# Patient Record
Sex: Male | Born: 1965
Health system: Southern US, Community
[De-identification: ages and names within clinical notes are randomized; demographics above are authoritative.]

## PROBLEM LIST (undated history)

## (undated) DIAGNOSIS — N529 Male erectile dysfunction, unspecified: Secondary | ICD-10-CM

## (undated) DIAGNOSIS — G4730 Sleep apnea, unspecified: Secondary | ICD-10-CM

## (undated) DIAGNOSIS — I10 Essential (primary) hypertension: Secondary | ICD-10-CM

## (undated) DIAGNOSIS — E785 Hyperlipidemia, unspecified: Secondary | ICD-10-CM

## (undated) HISTORY — DX: Hyperlipidemia, unspecified: E78.5

## (undated) HISTORY — DX: Essential (primary) hypertension: I10

## (undated) HISTORY — DX: Male erectile dysfunction, unspecified: N52.9

## (undated) HISTORY — DX: Sleep apnea, unspecified: G47.30

## (undated) HISTORY — PX: CIRCUMCISION: SUR203

---

## 2004-05-29 ENCOUNTER — Ambulatory Visit (HOSPITAL_BASED_OUTPATIENT_CLINIC_OR_DEPARTMENT_OTHER): Admission: RE | Admit: 2004-05-29 | Discharge: 2004-05-29 | Payer: Self-pay | Admitting: Internal Medicine

## 2004-12-15 ENCOUNTER — Ambulatory Visit: Payer: Self-pay | Admitting: Internal Medicine

## 2005-01-23 ENCOUNTER — Ambulatory Visit (HOSPITAL_COMMUNITY): Admission: RE | Admit: 2005-01-23 | Discharge: 2005-01-23 | Payer: Self-pay | Admitting: Urology

## 2005-01-23 ENCOUNTER — Ambulatory Visit (HOSPITAL_BASED_OUTPATIENT_CLINIC_OR_DEPARTMENT_OTHER): Admission: RE | Admit: 2005-01-23 | Discharge: 2005-01-23 | Payer: Self-pay | Admitting: Urology

## 2007-09-26 ENCOUNTER — Ambulatory Visit: Payer: Self-pay | Admitting: Internal Medicine

## 2007-12-26 ENCOUNTER — Telehealth: Payer: Self-pay | Admitting: Internal Medicine

## 2008-01-09 ENCOUNTER — Encounter: Payer: Self-pay | Admitting: Internal Medicine

## 2009-10-01 ENCOUNTER — Ambulatory Visit: Payer: Self-pay | Admitting: Family Medicine

## 2010-06-19 ENCOUNTER — Ambulatory Visit: Payer: Self-pay | Admitting: Internal Medicine

## 2010-06-19 LAB — CONVERTED CEMR LAB
Blood in Urine, dipstick: NEGATIVE
Glucose, Urine, Semiquant: NEGATIVE
Protein, U semiquant: NEGATIVE
Urobilinogen, UA: 1
WBC Urine, dipstick: NEGATIVE
pH: 6

## 2010-06-20 LAB — CONVERTED CEMR LAB
ALT: 37 units/L (ref 0–53)
AST: 39 units/L — ABNORMAL HIGH (ref 0–37)
Alkaline Phosphatase: 76 units/L (ref 39–117)
BUN: 9 mg/dL (ref 6–23)
Basophils Relative: 0.6 % (ref 0.0–3.0)
Bilirubin, Direct: 0.1 mg/dL (ref 0.0–0.3)
Chloride: 106 meq/L (ref 96–112)
Creatinine, Ser: 0.7 mg/dL (ref 0.4–1.5)
Eosinophils Relative: 1.8 % (ref 0.0–5.0)
GFR calc non Af Amer: 151.46 mL/min (ref >60)
HDL: 39.7 mg/dL (ref >39.00)
MCV: 89 fL (ref 78.0–100.0)
Monocytes Relative: 6.4 % (ref 3.0–12.0)
Neutrophils Relative %: 59.5 % (ref 43.0–77.0)
Platelets: 166 10*3/uL (ref 150.0–400.0)
Potassium: 3.5 meq/L (ref 3.5–5.1)
RBC: 4.47 M/uL (ref 4.22–5.81)
Total Bilirubin: 0.5 mg/dL (ref 0.3–1.2)
Total CHOL/HDL Ratio: 5
Total Protein: 6.4 g/dL (ref 6.0–8.3)
VLDL: 27 mg/dL (ref 0.0–40.0)
WBC: 5.4 10*3/uL (ref 4.5–10.5)

## 2010-06-26 ENCOUNTER — Ambulatory Visit: Payer: Self-pay | Admitting: Internal Medicine

## 2010-06-26 DIAGNOSIS — I10 Essential (primary) hypertension: Secondary | ICD-10-CM | POA: Insufficient documentation

## 2010-06-26 DIAGNOSIS — E785 Hyperlipidemia, unspecified: Secondary | ICD-10-CM | POA: Insufficient documentation

## 2010-08-14 ENCOUNTER — Encounter
Admission: RE | Admit: 2010-08-14 | Discharge: 2010-08-14 | Payer: Self-pay | Source: Home / Self Care | Attending: Internal Medicine | Admitting: Internal Medicine

## 2010-09-25 ENCOUNTER — Ambulatory Visit: Payer: Self-pay | Admitting: Internal Medicine

## 2010-11-18 NOTE — Assessment & Plan Note (Signed)
Summary: CPX/NJR   Vital Signs:  Patient profile:   45 year old male Height:      72 inches (182.88 cm) Weight:      273.50 pounds (124.32 kg) BMI:     37.23 Temp:     98.6 degrees F (37.00 degrees C) oral BP sitting:   140 / 100  (left arm) Cuff size:   large  Vitals Entered By: Lucious Groves CMA (June 26, 2010 9:57 AM) CC: CPX./kb Is Patient Diabetic? No Pain Assessment Patient in pain? no        CC:  CPX./kb.  History of Present Illness: cpx wants to lose weight  Current Problems (verified): 1)  Preventive Health Care  (ICD-V70.0)  Current Medications (verified): 1)  None  Allergies (verified): No Known Drug Allergies  Past History:  Past Medical History: Hyperlipidemia Hypertension--dx 2011 no meds as of 9/11  Past Surgical History: circumcision as adult  Family History: Family History Diabetes 1st degree relative--father (bilateral amputation) Family History Hypertension--father Sister---renal failure---? cause 2nd sister---htn  Physical Exam  General:  alert and well-developed.   Head:  normocephalic and atraumatic.   Eyes:  pupils equal and pupils round.   Ears:  R ear normal and L ear normal.   Neck:  No deformities, masses, or tenderness noted. Lungs:  Normal respiratory effort, chest expands symmetrically. Lungs are clear to auscultation, no crackles or wheezes. Heart:  normal rate and regular rhythm.   Abdomen:  Bowel sounds positive,abdomen soft and non-tender without masses, organomegaly or hernias noted.  overweight Msk:  No deformity or scoliosis noted of thoracic or lumbar spine.   Pulses:  R radial normal and L radial normal.   Neurologic:  cranial nerves II-XII intact and gait normal.     Impression & Recommendations:  Problem # 1:  PREVENTIVE HEALTH CARE (ICD-V70.0)  Problem # 2:  OBESITY (ICD-278.00)  Orders: Nutrition Referral (Nutrition)  Problem # 3:  HYPERLIPIDEMIA (ICD-272.4) Assessment: New discussed weght  loss diet low fat dietrefer nutrtionist Labs Reviewed: SGOT: 39 (06/19/2010)   SGPT: 37 (06/19/2010)   HDL:39.70 (06/19/2010)  Chol:216 (06/19/2010)  Trig:135.0 (06/19/2010)  Problem # 4:  HYPERTENSION (ICD-401.9) Assessment: New discussed this is almost certainly weight related discussed weight loss see me 3 months  Patient Instructions: 1)  Please schedule a follow-up appointment in 3 months.   Appended Document: CPX/NJR    Clinical Lists Changes  Orders: Added new Service order of Tdap => 11yrs IM (45409) - Signed Added new Service order of Admin 1st Vaccine (81191) - Signed Observations: Added new observation of TD BOOST VIS: 09/05/08 version given June 26, 2010. (06/26/2010 10:25) Added new observation of TD BOOSTERLO: [id number] (06/26/2010 10:25) Added new observation of TD BOOST EXP: 08/07/2012 (06/26/2010 10:25) Added new observation of TD BOOSTERBY: Lucious Groves CMA (06/26/2010 10:25) Added new observation of TD BOOSTERRT: IM (06/26/2010 10:25) Added new observation of TDBOOSTERDSE: 0.5 ml (06/26/2010 10:25) Added new observation of TD BOOSTERMF: Gl[employer] (06/26/2010 10:25) Added new observation of TD BOOST SIT: right deltoid (06/26/2010 10:25) Added new observation of TD BOOSTER: Tdap (06/26/2010 10:25)       Immunizations Administered:  Tetanus Vaccine:    Vaccine Type: Tdap    Site: right deltoid    Mfr: GlaxoSmithKline    Dose: 0.5 ml    Route: IM    Given by: Lucious Groves CMA    Exp. Date: 08/07/2012    Lot #: [id number]    VIS given: 09/05/08 version given  June 26, 2010.

## 2010-11-18 NOTE — Assessment & Plan Note (Signed)
Summary: 3 month rov/njr 8am/njr   Vital Signs:  Patient profile:   45 year old male Weight:      252 pounds Temp:     97.8 degrees F oral Pulse rate:   76 / minute Pulse rhythm:   regular Resp:     12 per minute BP sitting:   128 / 86  Vitals Entered By: Lynann Beaver CMA AAMA (September 25, 2010 8:15 AM) CC: wgt and BP check Is Patient Diabetic? No Pain Assessment Patient in pain? no        CC:  wgt and BP check.  History of Present Illness: f/u weight-eating healthier/ exercising--jumps rope frequently  time of his physical patient was noted to have hyperlipidemia hypertension. He decided to approach his problems with exercise and diet. He has lost a significant amount of weight.  Patient denies any chest pain, shortness breath, PND. He is exercising regular. He has noted some neck stiffness over the past 2 weeks.  Current Medications (verified): 1)  None  Allergies (verified): No Known Drug Allergies  Past History:  Past Medical History: Last updated: 06/26/2010 Hyperlipidemia Hypertension--dx 2011 no meds as of 9/11  Past Surgical History: Last updated: 06/26/2010 circumcision as adult  Family History: Last updated: 06/26/2010 Family History Diabetes 1st degree relative--father (bilateral amputation) Family History Hypertension--father Sister---renal failure---? cause 2nd sister---htn  Social History: Last updated: 06/03/2007 Married Never Smoked  Risk Factors: Smoking Status: never (06/03/2007)  Physical Exam  General:  well-developed well-nourished male in no acute distress. HEENT exam atraumatic, normocephalic symmetric her muscles are intact. Neck is supple with full range of motion. Chest her auscultation cardiac exam S1-S2 are regular.   Impression & Recommendations:  Problem # 1:  HYPERTENSION (ICD-401.9) blood pressure much improved. I suspect his are related to exercise and weight loss. He will continue the same. I've told him that  I would be glad to check him every 3 months just for a check in regarding his weight. He thinks he can manage this on his own. He'll see me in one year.  Problem # 2:  HYPERLIPIDEMIA (ICD-272.4)  Labs Reviewed: SGOT: 39 (06/19/2010)   SGPT: 37 (06/19/2010)   HDL:39.70 (06/19/2010)  Chol:216 (06/19/2010)  Trig:135.0 (06/19/2010)   Orders Added: 1)  Est. Patient Level III [16109]

## 2011-03-06 NOTE — Procedures (Signed)
NAME:  Daniel Rosales, Daniel Rosales             ACCOUNT NO.:  [id number]   MEDICAL RECORD NO.:  [id number]          PATIENT TYPE:  OUT   LOCATION:  SLEEP CENTER                 FACILITY:  [facility]   PHYSICIAN:  Marcelyn Bruins, M.D. [facility] DATE OF BIRTH:  Jul 10, 1966   DATE OF ADMISSION:  05/29/2004  DATE OF DISCHARGE:  05/29/2004                              NOCTURNAL POLYSOMNOGRAM   REFERRING PHYSICIAN:  Bruce H. Swords, M.D.   INDICATION FOR THE STUDY:  Hypersomnia with sleep apnea.   Epworth sleepiness score is 18.   SLEEP ARCHITECTURE:  The patient had a total sleep time of 431 minutes with  adequate slow wave sleep but decreased REM.  Sleep onset latency was mildly  prolonged at 28 minutes, and REM latency was fairly short.  Sleep efficiency  was 89%.   IMPRESSION:  1. Mild obstructive sleep apnea hypopnea syndrome with oxygen desaturation     to 86%.  The patient had a respiratory disturbance index of 12.8 events     per hour.  The obstructive events were not positional, nor were they     associated primarily with REM.  The patient did not meet split night     criteria due to inadequate numbers of obstructive events in the set     amount of time by protocol.  2. Loud snoring noted throughout the study.  3. No clinically significant cardiac arrhythmias.                                   ______________________________                                Marcelyn Bruins, M.D. [facility]     KC/MEDQ  D:  06/10/2004 11:49:59  T:  06/11/2004 12:48:48  Job:  [id number]   cc:   Valetta Mole. Swords, M.D. [facility]

## 2011-03-06 NOTE — Op Note (Signed)
NAME:  Daniel Rosales, Daniel Rosales               ACCOUNT NO.:  [id number]   MEDICAL RECORD NO.:  [id number]          PATIENT TYPE:  AMB   LOCATION:  [facility]                         FACILITY[facility]   PHYSICIAN:  Bertram Millard. Dahlstedt, M.D.DATE OF BIRTH:  [birth year]   DATE OF PROCEDURE:  01/23/2005  DATE OF DISCHARGE:                                 OPERATIVE REPORT   PREOPERATIVE DIAGNOSIS:  Balanitis.   POSTOPERATIVE DIAGNOSIS:  Balanitis.   PRINCIPLE PROCEDURE:  Circumcision.   SURGEON:  Bertram Millard. Dahlstedt, M.D.   ANESTHESIA:  General.   COMPLICATIONS:  None.   BRIEF HISTORY:  A 45 year old male presenting about a month ago with  recurrent pain in his foreskin, especially after intercourse.  He was found  to have some balanitis and maceration of his foreskin.  He requested  circumcision.  He is scheduled at this time for the procedure.  With the  patient having been counseled on the risks and complications, he desires to  proceed.   PROCEDURE:  Patient was administered a general anesthetic.  He was placed in  the supine position.  The genitalia and perineum were prepped and draped.  Two circumcising incisions were made in the patient's foreskin, one proximal  and distal.  The foreskin was excised.  Small bleeders were carefully  electrocoagulated.  Plain Marcaine 0.5% 10 cc was placed as a dorsal penile  block at this time.  Two sutures were placed in the frenular skin, closing  this in a transverse manner.  Then 4-0 chromic was used.  At this point, a  U stitch was placed, connecting the frenular penile skin.  Quadrant  sutures were placed with simple sutures, using the same 4-0 chromic in  between.  The wound edges were closed in a running fashion.  Dry sterile  dressings were placed.  The patient tolerated the procedure well.  He was  awakened and taken to the PACU in stable condition.  He will follow up in  two weeks.  He was discharged with Vicodin and the usual circumcision  instructions.      SMD/MEDQ  D:  01/23/2005  T:  01/23/2005  Job:  [id number]

## 2011-05-18 ENCOUNTER — Encounter: Payer: Self-pay | Admitting: Internal Medicine

## 2011-05-19 ENCOUNTER — Encounter: Payer: Self-pay | Admitting: Internal Medicine

## 2011-05-19 ENCOUNTER — Ambulatory Visit (INDEPENDENT_AMBULATORY_CARE_PROVIDER_SITE_OTHER): Payer: 59 | Admitting: Internal Medicine

## 2011-05-19 VITALS — BP 132/84 | Temp 98.1°F | Ht 71.0 in | Wt 281.0 lb

## 2011-05-19 DIAGNOSIS — N390 Urinary tract infection, site not specified: Secondary | ICD-10-CM

## 2011-05-19 DIAGNOSIS — R319 Hematuria, unspecified: Secondary | ICD-10-CM | POA: Insufficient documentation

## 2011-05-19 LAB — POCT URINALYSIS DIPSTICK
Bilirubin, UA: NEGATIVE
Blood, UA: NEGATIVE
Glucose, UA: NEGATIVE
Ketones, UA: NEGATIVE
Nitrite, UA: NEGATIVE
Spec Grav, UA: 1.015
pH, UA: 5.5

## 2011-05-19 NOTE — Progress Notes (Signed)
  Subjective:    Patient ID: Daniel Rosales, male    DOB: 1966/09/10, 45 y.o.   MRN: [id number]  HPI Yesterday a.m. Noted hematuria associated with minimal dysuria. sxs have completely resolved He denies any flank pain No hx of hematuria. No recurrent dysuria.   Past Medical History  Diagnosis Date  . Hyperlipidemia   . Hypertension    Past Surgical History  Procedure Date  . Circumcision     reports that he has never smoked. He does not have any smokeless tobacco history on file. His alcohol and drug histories not on file. family history includes Diabetes in his father; Hypertension in his father and sister; and Kidney disease in his sister. No Known Allergies    Review of Systems  patient denies chest pain, shortness of breath, orthopnea. Denies lower extremity edema, abdominal pain, change in appetite, change in bowel movements. Patient denies rashes, musculoskeletal complaints. No other specific complaints in a complete review of systems.      Objective:   Physical Exam  well-developed well-nourished male in no acute distress. HEENT exam atraumatic, normocephalic, neck supple without jugular venous distention. Chest clear to auscultation cardiac exam S1-S2 are regular. Abdominal exam overweight with bowel sounds, soft and nontender. Extremities no edema. Neurologic exam is alert with a normal gait.        Assessment & Plan:

## 2011-05-19 NOTE — Assessment & Plan Note (Signed)
Painless hematuria Needs CT scan Check culture

## 2011-05-25 ENCOUNTER — Ambulatory Visit (INDEPENDENT_AMBULATORY_CARE_PROVIDER_SITE_OTHER)
Admission: RE | Admit: 2011-05-25 | Discharge: 2011-05-25 | Disposition: A | Discharge status: Home / Self Care | Payer: 59 | Source: Ambulatory Visit | Attending: Internal Medicine | Admitting: Internal Medicine

## 2011-05-25 DIAGNOSIS — R319 Hematuria, unspecified: Secondary | ICD-10-CM

## 2011-05-25 MED ORDER — IOHEXOL 300 MG/ML  SOLN
100.0000 mL | Freq: Once | INTRAMUSCULAR | Status: AC | PRN
  Administered 2011-05-25: 100 mL via INTRAVENOUS

## 2011-11-26 ENCOUNTER — Telehealth: Payer: Self-pay | Admitting: *Deleted

## 2011-11-26 ENCOUNTER — Ambulatory Visit (INDEPENDENT_AMBULATORY_CARE_PROVIDER_SITE_OTHER): Payer: 59 | Admitting: Family

## 2011-11-26 ENCOUNTER — Encounter: Payer: Self-pay | Admitting: Family

## 2011-11-26 VITALS — BP 158/90 | Temp 100.1°F | Wt 288.0 lb

## 2011-11-26 DIAGNOSIS — J209 Acute bronchitis, unspecified: Secondary | ICD-10-CM

## 2011-11-26 DIAGNOSIS — R05 Cough: Secondary | ICD-10-CM

## 2011-11-26 DIAGNOSIS — E669 Obesity, unspecified: Secondary | ICD-10-CM

## 2011-11-26 DIAGNOSIS — R059 Cough, unspecified: Secondary | ICD-10-CM

## 2011-11-26 MED ORDER — PREDNISONE 20 MG PO TABS: 60.0000 mg | ORAL_TABLET | Freq: Every day | ORAL | Status: AC

## 2011-11-26 MED ORDER — GUAIFENESIN-CODEINE 100-10 MG/5ML PO SYRP: 5.0000 mL | ORAL_SOLUTION | Freq: Three times a day (TID) | ORAL | Status: AC | PRN

## 2011-11-26 NOTE — Telephone Encounter (Signed)
Appt scheduled with Padonda. 

## 2011-11-26 NOTE — Patient Instructions (Signed)

## 2011-11-26 NOTE — Telephone Encounter (Signed)
Left message to call back for appt

## 2011-11-26 NOTE — Progress Notes (Deleted)
  Subjective:    Patient ID: Daniel Rosales, male    DOB: 13-May-1966, 46 y.o.   MRN: [id number]  HPI    Review of Systems     Objective:   Physical Exam        Assessment & Plan:

## 2011-11-26 NOTE — Progress Notes (Signed)
  Subjective:    Patient ID: Daniel Rosales, male    DOB: [birth year], 46 y.o.   MRN: [id number]  HPI Comments: C/o hacking, constant, nonproductive cough x two days with fatigue. Denies dyspnea, sorethroat, headaches, or other associated s/s.   Cough Associated symptoms include a fever. Pertinent negatives include no shortness of breath or wheezing.  Fever  Associated symptoms include coughing. Pertinent negatives include no wheezing.      Review of Systems  Constitutional: Positive for fever.  HENT: Negative.   Eyes: Negative.   Respiratory: Positive for cough. Negative for apnea, choking, chest tightness, shortness of breath, wheezing and stridor.   Cardiovascular: Negative.    Past Medical History  Diagnosis Date  . Hyperlipidemia   . Hypertension     History   Social History  . Marital Status: Married    Spouse Name: N/A    Number of Children: N/A  . Years of Education: N/A   Occupational History  . Not on file.   Social History Main Topics  . Smoking status: Never Smoker   . Smokeless tobacco: Not on file  . Alcohol Use:   . Drug Use:   . Sexually Active:    Other Topics Concern  . Not on file   Social History Narrative  . No narrative on file    Past Surgical History  Procedure Date  . Circumcision     Family History  Problem Relation Age of Onset  . Diabetes Father   . Hypertension Father   . Kidney disease Sister     renal failure  . Hypertension Sister     No Known Allergies  No current outpatient prescriptions on file prior to visit.    BP 158/90  Temp(Src) 100.1 F (37.8 C) (Oral)  Wt 288 lb (130.636 kg)chart    Objective:   Physical Exam  Constitutional: He is oriented to person, place, and time. He appears well-developed and well-nourished. No distress.  Cardiovascular: Normal rate, regular rhythm, normal heart sounds and intact distal pulses.  Exam reveals no gallop and no friction rub.   No murmur heard. Pulmonary/Chest:  Effort normal and breath sounds normal. No respiratory distress. He has no wheezes. He has no rales.  Neurological: He is alert and oriented to person, place, and time.  Skin: Skin is warm and dry. He is not diaphoretic.          Assessment & Plan:  Assessment: Bronchitis, cough, obesity  Plan: robitussin cough syrup, prednisone, increase po fluids, rest, schedule physical exam. Call if s/s get worse, teaching handout bronchitis provided,

## 2011-12-02 ENCOUNTER — Encounter: Payer: Self-pay | Admitting: Family

## 2011-12-02 ENCOUNTER — Ambulatory Visit (INDEPENDENT_AMBULATORY_CARE_PROVIDER_SITE_OTHER): Payer: 59 | Admitting: Family

## 2011-12-02 VITALS — BP 140/100 | HR 118 | Temp 98.3°F | Wt 288.0 lb

## 2011-12-02 DIAGNOSIS — R05 Cough: Secondary | ICD-10-CM

## 2011-12-02 DIAGNOSIS — R059 Cough, unspecified: Secondary | ICD-10-CM

## 2011-12-02 DIAGNOSIS — J209 Acute bronchitis, unspecified: Secondary | ICD-10-CM

## 2011-12-02 MED ORDER — HYDROCOD POLST-CHLORPHEN POLST 10-8 MG/5ML PO LQCR: 5.0000 mL | Freq: Two times a day (BID) | ORAL | Status: DC

## 2011-12-02 MED ORDER — METHYLPREDNISOLONE ACETATE 80 MG/ML IJ SUSP
80.0000 mg | Freq: Once | INTRAMUSCULAR | Status: AC
  Administered 2011-12-02: 80 mg via INTRAMUSCULAR

## 2011-12-02 MED ORDER — AZITHROMYCIN 250 MG PO TABS: 250.0000 mg | ORAL_TABLET | Freq: Every day | ORAL | Status: AC

## 2011-12-02 NOTE — Patient Instructions (Signed)

## 2011-12-02 NOTE — Progress Notes (Signed)
  Subjective:    Patient ID: Daniel Rosales, male    DOB: 09-Mar-1966, 46 y.o.   MRN: [id number]  HPI 46 year old nonsmoker, patient of Dr. Cato Mulligan is in with complaints of cough, congestion, wheezing that has not improved since last week. He was seen here last week treated with prednisone 60 mg for 5 days and Robitussin with codeine. He continues to have chest congestion productive cough white phlegm. He's also been taking Benadryl and equal with no relief. Denies any lightheadedness, dizziness, chest pain, palpitations or edema.   Review of Systems  Constitutional: Positive for fever, chills and fatigue.  HENT: Positive for congestion, sneezing, postnasal drip and sinus pressure.   Respiratory: Positive for cough and wheezing.   Cardiovascular: Negative.   Musculoskeletal: Negative.   Skin: Negative.   Neurological: Negative.   Hematological: Negative.        Past Medical History  Diagnosis Date  . Hyperlipidemia   . Hypertension     History   Social History  . Marital Status: Married    Spouse Name: N/A    Number of Children: N/A  . Years of Education: N/A   Occupational History  . Not on file.   Social History Main Topics  . Smoking status: Never Smoker   . Smokeless tobacco: Not on file  . Alcohol Use:   . Drug Use:   . Sexually Active:    Other Topics Concern  . Not on file   Social History Narrative  . No narrative on file    Past Surgical History  Procedure Date  . Circumcision     Family History  Problem Relation Age of Onset  . Diabetes Father   . Hypertension Father   . Kidney disease Sister     renal failure  . Hypertension Sister     No Known Allergies  Current Outpatient Prescriptions on File Prior to Visit  Medication Sig Dispense Refill  . guaiFENesin-codeine (ROBITUSSIN AC) 100-10 MG/5ML syrup Take 5 mLs by mouth 3 (three) times daily as needed for cough.  120 mL  0  . predniSONE (DELTASONE) 20 MG tablet Take 3 tablets (60 mg total)  by mouth daily.  15 tablet  0   No current facility-administered medications on file prior to visit.    BP 140/100  Pulse 118  Temp(Src) 98.3 F (36.8 C) (Oral)  Wt 288 lb (130.636 kg)  SpO2 98%chart Objective:   Physical Exam  Constitutional: He is oriented to person, place, and time. He appears well-developed and well-nourished.  HENT:  Right Ear: External ear normal.  Left Ear: External ear normal.  Nose: Nose normal.  Mouth/Throat: Oropharynx is clear and moist.  Neck: Normal range of motion. Neck supple.  Cardiovascular: Normal rate, regular rhythm and normal heart sounds.   Pulmonary/Chest: Effort normal. He has wheezes.       Very mild wheezing.  Musculoskeletal: Normal range of motion.  Neurological: He is alert and oriented to person, place, and time.  Skin: Skin is warm and dry.  Psychiatric: He has a normal mood and affect.          Assessment & Plan:  Assessment: Bronchitis-uncontrolled, cough  Plan: Dulera 100.5 2 puffs twice a day. Z-Pak as directed. Tussionex pen kinetic 1 teaspoon twice a day when necessary cough. Depo-Medrol 80 mg IM x1 was given in the office. Patient to call the office if symptoms worsen or persist, recheck as scheduled and when necessary.

## 2012-03-23 ENCOUNTER — Encounter: Payer: 59 | Admitting: Internal Medicine

## 2012-04-14 ENCOUNTER — Other Ambulatory Visit (INDEPENDENT_AMBULATORY_CARE_PROVIDER_SITE_OTHER): Payer: 59

## 2012-04-14 DIAGNOSIS — Z0000 Encounter for general adult medical examination without abnormal findings: Secondary | ICD-10-CM

## 2012-04-14 LAB — CBC WITH DIFFERENTIAL/PLATELET
Basophils Relative: 0.8 % (ref 0.0–3.0)
Eosinophils Absolute: 0.1 10*3/uL (ref 0.0–0.7)
Eosinophils Relative: 2.4 % (ref 0.0–5.0)
Lymphocytes Relative: 35.4 % (ref 12.0–46.0)
MCHC: 33.1 g/dL (ref 30.0–36.0)
Neutrophils Relative %: 54.4 % (ref 43.0–77.0)
Platelets: 176 10*3/uL (ref 150.0–400.0)
RBC: 4.67 Mil/uL (ref 4.22–5.81)
WBC: 4.7 10*3/uL (ref 4.5–10.5)

## 2012-04-14 LAB — HEPATIC FUNCTION PANEL
Alkaline Phosphatase: 71 U/L (ref 39–117)
Bilirubin, Direct: 0 mg/dL (ref 0.0–0.3)
Total Bilirubin: 0.5 mg/dL (ref 0.3–1.2)
Total Protein: 7 g/dL (ref 6.0–8.3)

## 2012-04-14 LAB — LIPID PANEL
Total CHOL/HDL Ratio: 6
Triglycerides: 118 mg/dL (ref 0.0–149.0)
VLDL: 23.6 mg/dL (ref 0.0–40.0)

## 2012-04-14 LAB — BASIC METABOLIC PANEL
Calcium: 9.1 mg/dL (ref 8.4–10.5)
Creatinine, Ser: 0.8 mg/dL (ref 0.4–1.5)

## 2012-04-14 LAB — POCT URINALYSIS DIPSTICK
Bilirubin, UA: NEGATIVE
Blood, UA: NEGATIVE
Leukocytes, UA: NEGATIVE
Nitrite, UA: NEGATIVE
Protein, UA: NEGATIVE
Urobilinogen, UA: 0.2
pH, UA: 7

## 2012-04-25 ENCOUNTER — Encounter: Payer: Self-pay | Admitting: Internal Medicine

## 2012-04-25 ENCOUNTER — Ambulatory Visit (INDEPENDENT_AMBULATORY_CARE_PROVIDER_SITE_OTHER): Payer: 59 | Admitting: Internal Medicine

## 2012-04-25 VITALS — BP 168/100 | HR 80 | Temp 98.3°F | Ht 70.0 in | Wt 281.0 lb

## 2012-04-25 DIAGNOSIS — Z0000 Encounter for general adult medical examination without abnormal findings: Secondary | ICD-10-CM

## 2012-04-25 DIAGNOSIS — I10 Essential (primary) hypertension: Secondary | ICD-10-CM

## 2012-04-25 DIAGNOSIS — R7309 Other abnormal glucose: Secondary | ICD-10-CM

## 2012-04-25 DIAGNOSIS — R739 Hyperglycemia, unspecified: Secondary | ICD-10-CM | POA: Insufficient documentation

## 2012-04-25 MED ORDER — LISINOPRIL 20 MG PO TABS: 20.0000 mg | ORAL_TABLET | Freq: Every day | ORAL | Status: DC

## 2012-04-25 NOTE — Progress Notes (Signed)
Patient ID: Daniel Rosales, male   DOB: 1966/03/04, 46 y.o.   MRN: [id number]  cpx  htn-- currently on no meds  overweight- not following an exercise or diet plan.   Past Medical History  Diagnosis Date  . Hyperlipidemia   . Hypertension     History   Social History  . Marital Status: Married    Spouse Name: N/A    Number of Children: N/A  . Years of Education: N/A   Occupational History  . Not on file.   Social History Main Topics  . Smoking status: Never Smoker   . Smokeless tobacco: Not on file  . Alcohol Use:   . Drug Use:   . Sexually Active:    Other Topics Concern  . 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 Circumcision     Family History  Problem Relation Age of Onset  . Diabetes Father   . Hypertension Father   . Kidney disease Sister     renal failure  . Hypertension Sister   . Diabetes type II Sister   . Diabetes type II Mother   . Diabetes type II Brother     No Known Allergies  No current outpatient prescriptions on file prior to visit.     patient denies chest pain, shortness of breath, orthopnea. Denies lower extremity edema, abdominal pain, change in appetite, change in bowel movements. Patient denies rashes, musculoskeletal complaints. No other specific complaints in a complete review of systems.   BP 168/100  Pulse 80  Temp 98.3 F (36.8 C) (Oral)  Ht 5\' 10"  (1.778 m)  Wt 281 lb (127.461 kg)  BMI 40.32 kg/m2 Well-developed male in no acute distress. HEENT exam atraumatic, normocephalic, extraocular muscles are intact. Conjunctivae are pink without exudate. Neck is supple without lymphadenopathy, thyromegaly, jugular venous distention. Chest is clear to auscultation without increased work of breathing. Cardiac exam S1-S2 are regular. The PMI is normal. No significant murmurs or gallops. Abdominal exam active bowel sounds, soft, nontender. No  abdominal bruits. Extremities no clubbing cyanosis or edema. Peripheral pulses are normal without bruits. Neurologic exam alert and oriented without any motor or sensory deficits.   Assessment and plan: Well visit. Health maintenance up-to-date. Discussed need for weight loss and blood pressure control.

## 2012-04-25 NOTE — Assessment & Plan Note (Signed)
Needs to start treatment Lisinopril 20

## 2012-04-27 ENCOUNTER — Encounter: Payer: 59 | Admitting: Internal Medicine

## 2012-05-25 ENCOUNTER — Ambulatory Visit: Payer: 59 | Admitting: Internal Medicine

## 2012-05-26 ENCOUNTER — Ambulatory Visit: Payer: 59 | Admitting: *Deleted

## 2012-10-26 ENCOUNTER — Other Ambulatory Visit (INDEPENDENT_AMBULATORY_CARE_PROVIDER_SITE_OTHER): Payer: Self-pay

## 2012-10-26 DIAGNOSIS — R7309 Other abnormal glucose: Secondary | ICD-10-CM

## 2012-10-26 DIAGNOSIS — R739 Hyperglycemia, unspecified: Secondary | ICD-10-CM

## 2012-10-26 LAB — LIPID PANEL
HDL: 41.5 mg/dL (ref >39.00)
Triglycerides: 126 mg/dL (ref 0.0–149.0)

## 2012-10-26 LAB — LDL CHOLESTEROL, DIRECT: Direct LDL: 155.3 mg/dL

## 2012-10-26 LAB — HEPATIC FUNCTION PANEL
ALT: 34 U/L (ref 0–53)
AST: 21 U/L (ref 0–37)
Bilirubin, Direct: 0.1 mg/dL (ref 0.0–0.3)
Total Protein: 6.7 g/dL (ref 6.0–8.3)

## 2012-10-26 LAB — BASIC METABOLIC PANEL
Calcium: 8.8 mg/dL (ref 8.4–10.5)
GFR: 143 mL/min (ref >60.00)
Potassium: 3.8 mEq/L (ref 3.5–5.1)
Sodium: 140 mEq/L (ref 135–145)

## 2012-10-26 LAB — HEMOGLOBIN A1C: Hgb A1c MFr Bld: 5.1 % (ref 4.6–6.5)

## 2012-12-12 ENCOUNTER — Telehealth: Payer: Self-pay | Admitting: Internal Medicine

## 2012-12-12 NOTE — Telephone Encounter (Signed)
Patient Information:  Caller Name: Desiderio  Phone: [phone]  Patient: Daniel Rosales, Daniel Rosales  Gender: Male  DOB: Oct 11, 1966  Age: 47 Years  PCP: Birdie Sons (Adults only)  Office Follow Up:  Does the office need to follow up with this patient?: No  Instructions For The Office: N/A  RN Note:  Patient verbalized understanding of care advice.  Symptoms  Reason For Call & Symptoms: Larey Seat on the ice and Right knee is swollen and has a knot, knee is painful and sensitive to touch.  Patient has a hard time putting weight on that leg.  Reviewed Health History In EMR: Yes  Reviewed Medications In EMR: Yes  Reviewed Allergies In EMR: Yes  Reviewed Surgeries / Procedures: Yes  Date of Onset of Symptoms: 11/30/2012  Guideline(s) Used:  Knee Injury  Disposition Per Guideline:   See Today in Office  Reason For Disposition Reached:   Patient wants to be seen  Advice Given:  Reassurance - Bending or Twisting Injury (Strain, Sprain):  Strain and sprain are the medical terms used to describe over-stretching of the muscles and ligaments of the knee. A twisting or bending injury can cause a strain or sprain.  The main symptom is pain that is worse with movement and walking. Swelling can occur. Rarely there may be slight bruising.  Here is some care advice that should help.  Apply Heat to the Area:  Beginning 48 hours after an injury, apply a warm washcloth or heating pad for 10 minutes three times a day.  This will help increase blood flow and improve healing.  Wrap with an Elastic Bandage:  Wrap the injured part with a snug, elastic bandage for 48 hours.  The pressure from the bandage can make it feel better and help prevent swelling.  If your start to get numbness or tingling of your foot or toes, the bandage may be too tight. Loosen the bandage wrap.  Elevate the Leg:  Lay down and put your leg on a pillow. This puts (elevates) the knee above the heart.  Elevate the Leg:  Lay down and put  your leg on a pillow. This puts (elevates) the knee above the heart.  Do this for 15-20 minutes, 2-3 times a day, for the first two days.  Rest vs. Movement:  Movement is generally more healing in the long term than rest.  Continue normal activities (like walking) as much as your pain permits.  Avoid running and active sports for 1-2 weeks or until the pain and swelling are gone.  Complete rest should only be used for the first day or two after an injury. If it really hurts too much to walk, you will need to see the doctor.  Call Back If:  Pain becomes severe  You become worse.  Appointment Scheduled:  12/12/2012 15:00:00 Appointment Scheduled Provider:  Kriste Basque [facility])

## 2012-12-13 ENCOUNTER — Ambulatory Visit (INDEPENDENT_AMBULATORY_CARE_PROVIDER_SITE_OTHER)
Admission: RE | Admit: 2012-12-13 | Discharge: 2012-12-13 | Disposition: A | Discharge status: Home / Self Care | Payer: BC Managed Care – PPO | Source: Ambulatory Visit | Attending: Family Medicine | Admitting: Family Medicine

## 2012-12-13 ENCOUNTER — Telehealth: Payer: Self-pay | Admitting: Family Medicine

## 2012-12-13 ENCOUNTER — Ambulatory Visit (INDEPENDENT_AMBULATORY_CARE_PROVIDER_SITE_OTHER): Payer: BC Managed Care – PPO | Admitting: Family Medicine

## 2012-12-13 ENCOUNTER — Encounter: Payer: Self-pay | Admitting: Family Medicine

## 2012-12-13 VITALS — BP 162/80 | HR 66 | Temp 98.6°F | Wt 284.0 lb

## 2012-12-13 DIAGNOSIS — M79661 Pain in right lower leg: Secondary | ICD-10-CM

## 2012-12-13 DIAGNOSIS — M949 Disorder of cartilage, unspecified: Secondary | ICD-10-CM

## 2012-12-13 DIAGNOSIS — M899 Disorder of bone, unspecified: Secondary | ICD-10-CM

## 2012-12-13 NOTE — Telephone Encounter (Signed)
Pleas elet him know xray was negative. Advise ice for 15 minutes twice daily and ibuprofen or tylenol if needed for the pain. Follow up with his doctor if continued pain in 2-3 weeks

## 2012-12-13 NOTE — Progress Notes (Signed)
Chief Complaint  Patient presents with  . fall on ice    right knee has a knot; difficult to walk     HPI:  Acute visit for knee pain -started after slipped on ice about 2 weeks ago - did not fall - caught himself but twisted leg -was able to bear weight -pain was not bad - but had some worsening at the end of last week - worsened today after hitting sup tibia on a chair -described as pain in R superior anterior tibia - reports has noticed a knot here -worse with going up stairs -better with rest -denies: giving away, weakness, fevers, severe pain, numbness, clicking or popping   ROS: See pertinent positives and negatives per HPI.  Past Medical History  Diagnosis Date  . Hyperlipidemia   . Hypertension     Family History  Problem Relation Age of Onset  . Diabetes Father   . Hypertension Father   . Kidney disease Sister     renal failure  . Hypertension Sister   . Diabetes type II Sister   . Diabetes type II Mother   . Diabetes type II Brother     History   Social History  . Marital Status: Married    Spouse Name: N/A    Number of Children: N/A  . Years of Education: N/A   Social History Main Topics  . Smoking status: Never Smoker   . Smokeless tobacco: None  . Alcohol Use:   . Drug Use:   . Sexually Active:    Other Topics Concern  . None   Social History Narrative   Works for time Psychologist, forensic   Part timeAdult nurse, Paediatric nurse    Current outpatient prescriptions:lisinopril (PRINIVIL,ZESTRIL) 20 MG tablet, Take 1 tablet (20 mg total) by mouth daily., Disp: 90 tablet, Rfl: 3  EXAM:  Filed Vitals:   12/13/12 1515  BP: 162/80  Pulse: 66  Temp: 98.6 F (37 C)    Body mass index is 40.75 kg/(m^2).  GENERAL: vitals reviewed and listed above, alert, oriented, appears well hydrated and in no acute distress  HEENT: atraumatic, conjunttiva clear, no obvious abnormalities on inspection of external nose and ears  NECK: no obvious masses on inspection  KNEE:   Normal gait, normal ROM and strenght in LE bilat, NV intact bilat LEs On inspection small area of swelling over ant sup tibia near patellar insertion, no erythema, TTP in this area, Neg ant/post drawer, neg lachmans, neg mcmurry. Neg patellar crepitis, no effusion  MS: moves all extremities without noticeable abnormality  PSYCH: pleasant and cooperative, no obvious depression or anxiety  ASSESSMENT AND PLAN:  Discussed the following assessment and plan:  Tibial pain, right - Plan: DG Tibia/Fibula Right -suspect finding on exam more likely from him hitting sup tibia on a chair today - likley soft tissue contusion but will get plain film to exclude fx -otherwise advised ice and gentle activity, ibuprofen or tylenol if needed and follow up in 1 month -Patient advised to return or notify a doctor immediately if symptoms worsen or persist or new concerns arise.  There are no Patient Instructions on file for this visit.   Kriste Basque R.

## 2012-12-14 NOTE — Telephone Encounter (Signed)
Called and spoke with pt and pt is aware.  

## 2013-07-16 IMAGING — CR DG TIBIA/FIBULA 2V*R*
4 series · 4 of 4 positions shown · non-contrast
Comparison: None.

CLINICAL DATA: Pain post injury 2 weeks ago

RIGHT TIBIA AND FIBULA - 2 VIEW

[view not recorded (1 of 4)]
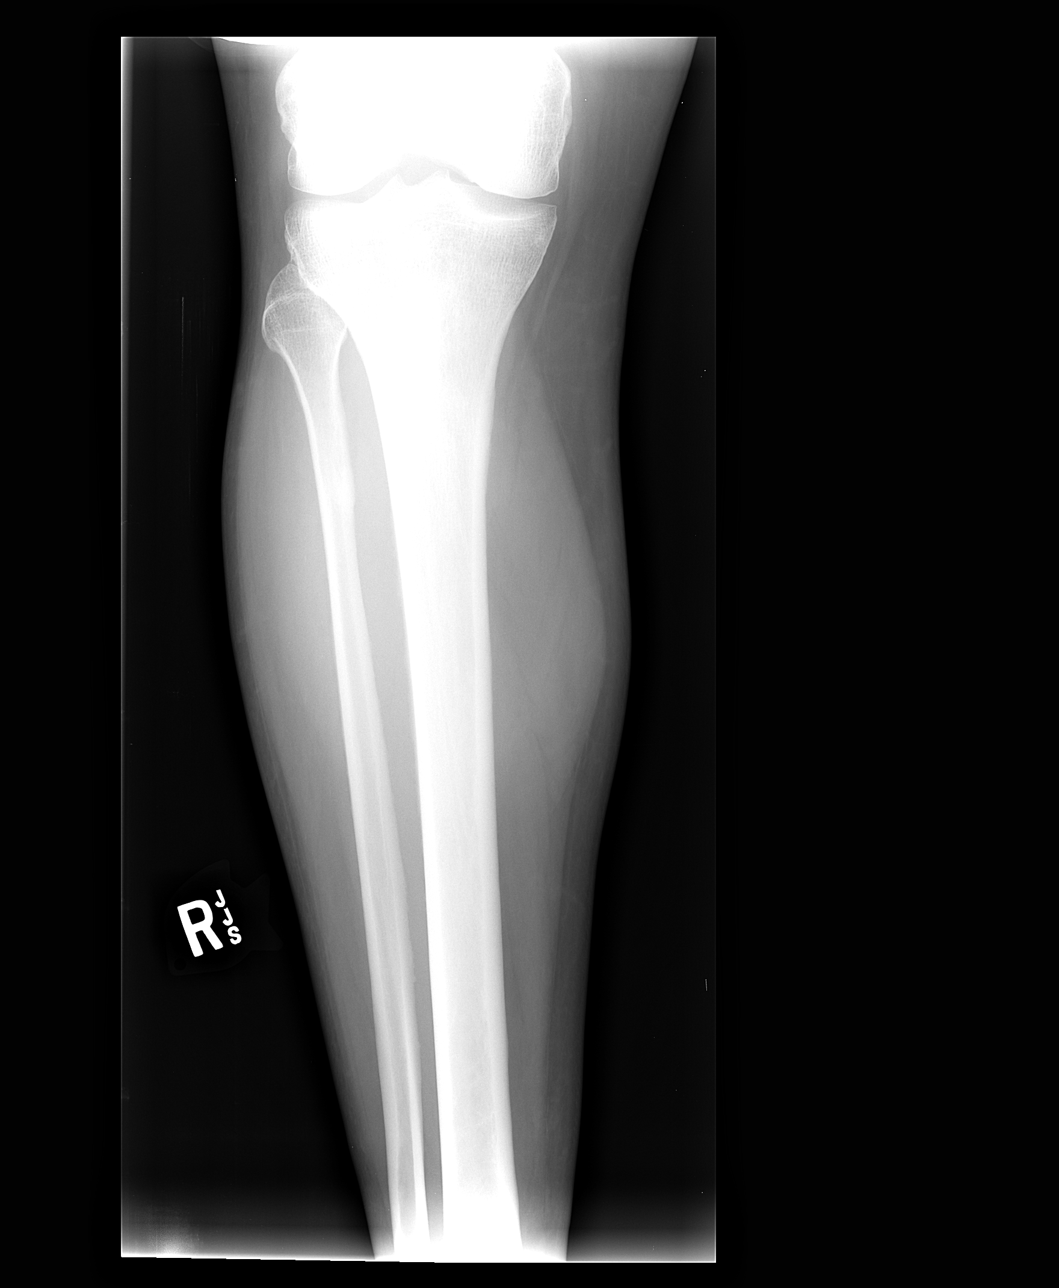

[view not recorded (2 of 4)]
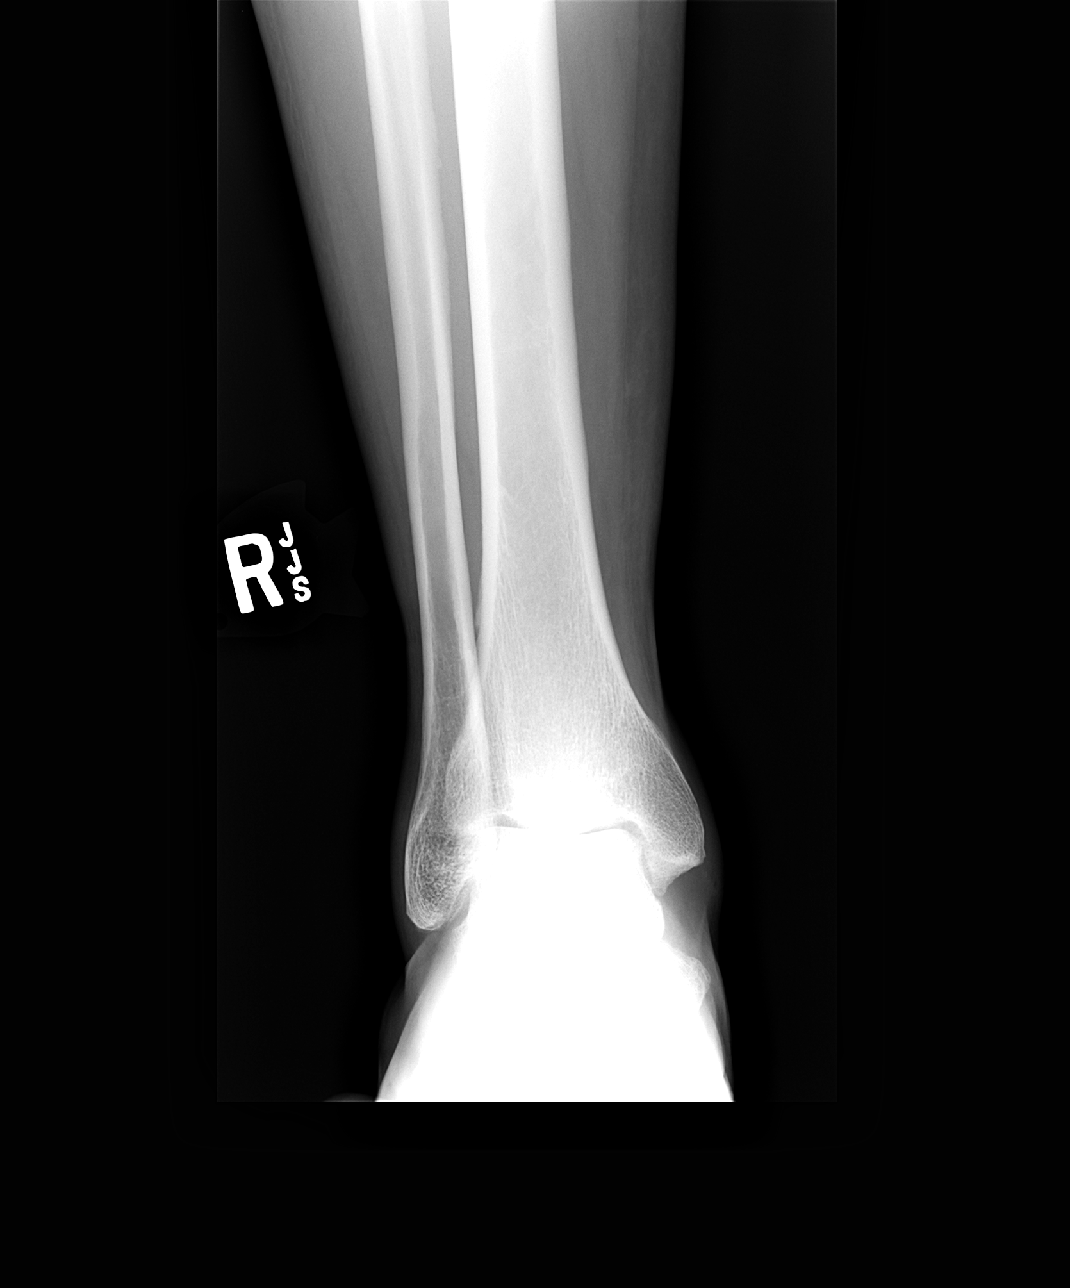

[view not recorded (3 of 4)]
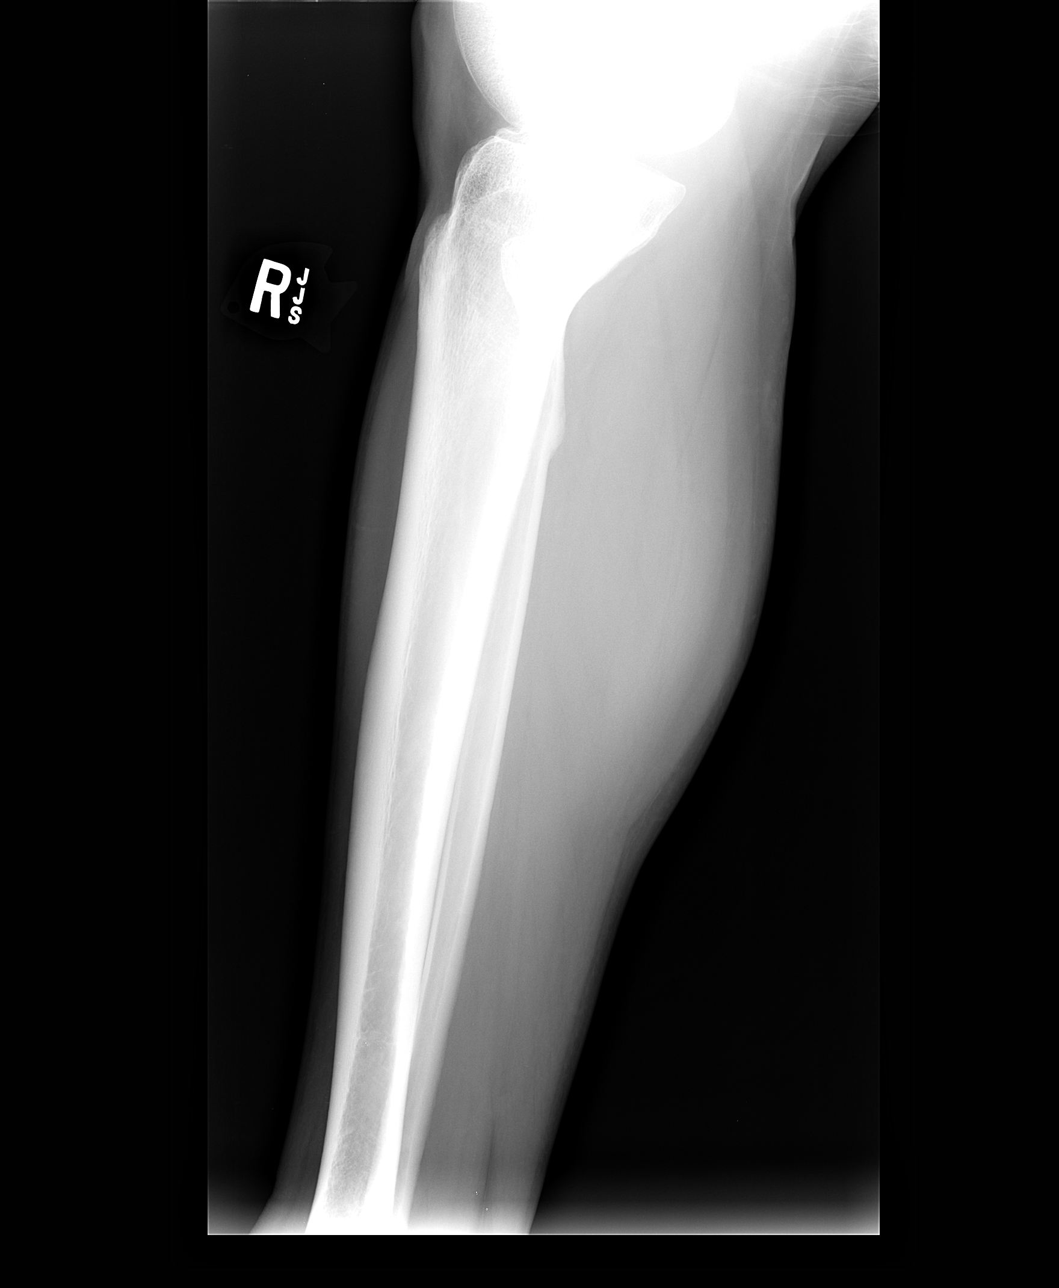

[view not recorded (4 of 4)]
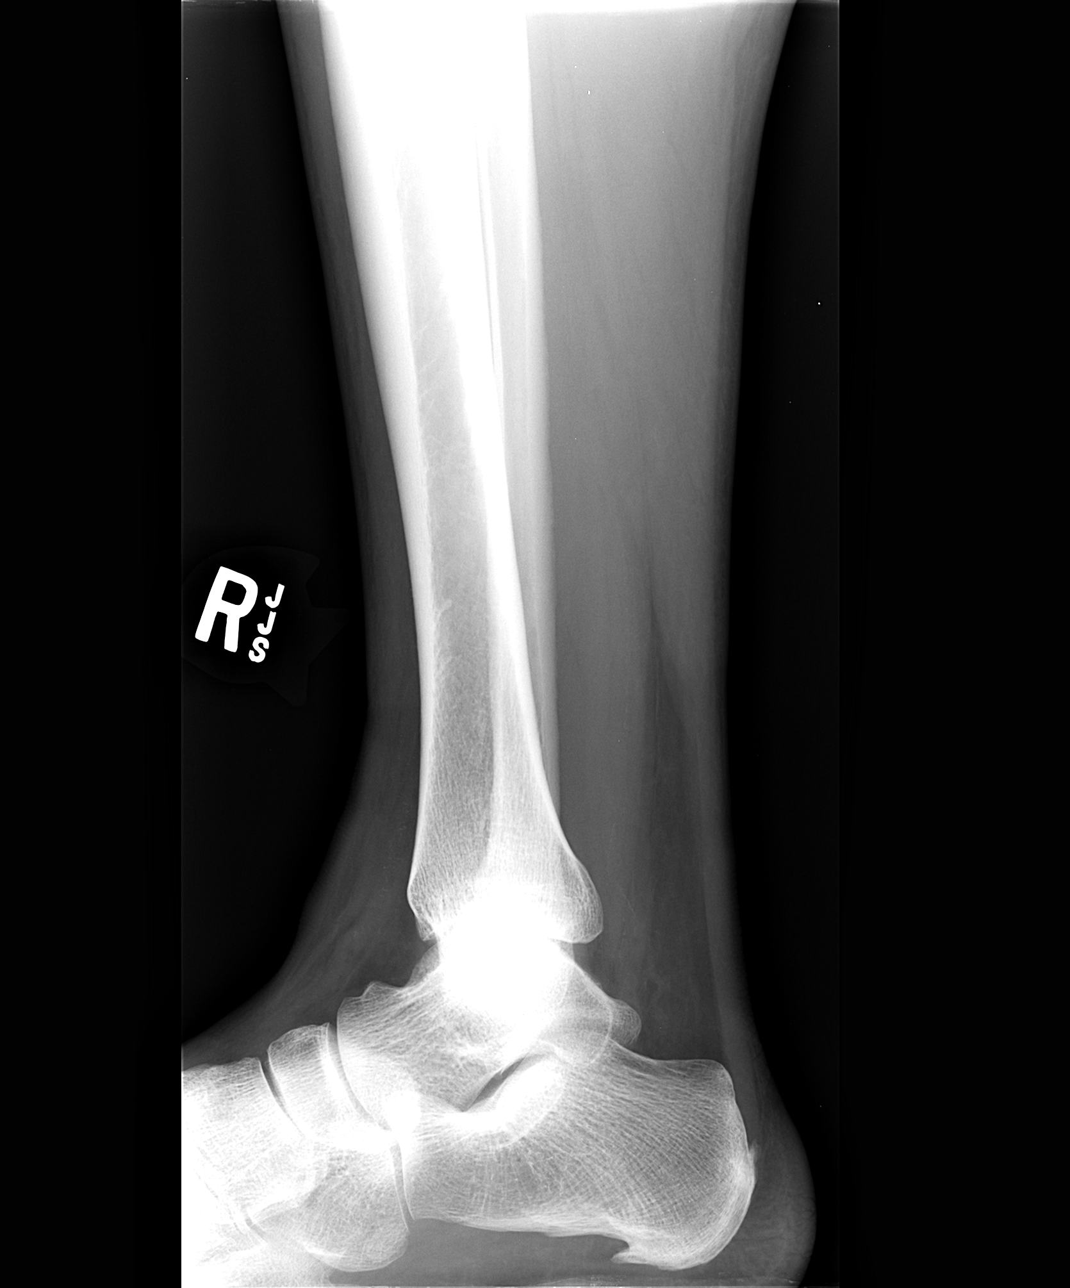

[4 of 4 positions shown; findings below may reference images not displayed]

FINDINGS: Four views of the right tibia-fibula submitted.  No acute
fracture or subluxation.  No periosteal reaction or bony erosion.
Plantar spur of the calcaneus.
IMPRESSION: No acute fracture or subluxation.

## 2015-03-05 ENCOUNTER — Ambulatory Visit (INDEPENDENT_AMBULATORY_CARE_PROVIDER_SITE_OTHER): Payer: Self-pay | Admitting: Family Medicine

## 2015-03-05 ENCOUNTER — Encounter: Payer: Self-pay | Admitting: Family Medicine

## 2015-03-05 VITALS — BP 140/90 | HR 75 | Temp 99.2°F | Wt 280.0 lb

## 2015-03-05 DIAGNOSIS — B349 Viral infection, unspecified: Secondary | ICD-10-CM

## 2015-03-05 DIAGNOSIS — B9789 Other viral agents as the cause of diseases classified elsewhere: Secondary | ICD-10-CM

## 2015-03-05 DIAGNOSIS — J329 Chronic sinusitis, unspecified: Secondary | ICD-10-CM

## 2015-03-05 MED ORDER — GUAIFENESIN-CODEINE 100-10 MG/5ML PO SOLN: 5.0000 mL | Freq: Four times a day (QID) | ORAL | Status: DC | PRN

## 2015-03-05 MED ORDER — PREDNISONE 20 MG PO TABS: ORAL_TABLET | ORAL | Status: DC

## 2015-03-05 NOTE — Patient Instructions (Signed)
Viral Sinus infection  Rest is most important thing- take off next 2 days and today  Lots of hydration  Prednisone to try to calm inflammation  Codeine cough syrup for use at night  If symptoms continue through Monday or if get better then get worse-likely a sign of bacterial infection and will call in antibiotic for you  I also want you to schedule a 15 minute med check/BP check within 3 months and we will make a plan after that for next steps. BP up a little today but not feeling well.

## 2015-03-05 NOTE — Progress Notes (Signed)
  PCP: Judie PetitSWORDS,BRUCE HENRY, MD  Subjective:  Daniel BoutonBrian L Sonnenfeld is a 49 y.o. year old very pleasant male patient who presents with Sinusitis symptoms   Dry cough started Friday (Day 5 of illness), continued to worsen. Cough so much throat started hurting. Cough changed to productive with yellow or white sputum. Nasal congestion as well as nose running, blowing out mainly clear from nose. Feels some wheeze with breathing at times. Symptoms continue to worsen. Has taken corricidan chest congestion and cough. No sick contacts. Some seasonal allergies. Some dental pain. Pain throughout frontal and maxillary sinuses.   ROS-denies fever, SOB, NV,  slight diarrhea  Pertinent Past Medical History- hypertension, HLD  Medications- reviewed  Current Outpatient Prescriptions  Medication Sig Dispense Refill  . lisinopril (PRINIVIL,ZESTRIL) 20 MG tablet Take 20 mg by mouth daily.     No current facility-administered medications for this visit.    Objective: BP 140/90 mmHg  Pulse 75  Temp(Src) 99.2 F (37.3 C)  Wt 280 lb (127.007 kg) Gen: NAD, resting comfortably HEENT: Turbinates erythematous with mainly clear drainage, TM normal, pharynx mildly erythematous with no tonsilar exudate or edema, mild sinus tenderness CV: RRR no murmurs rubs or gallops Lungs: CTAB no crackles, wheeze, rhonchi Abdomen: soft/nontender/nondistended/normal bowel sounds. Obese  Ext: no edema Skin: warm, dry, no rash  Assessment/Plan:  Viral Sinusitis I have a strong suspicion this will end up meeting qualifications for bacterial sinusitis given 5 days of symptoms that continue to worsen. We decided to treat with prednisone and if not 75% better by day 11, Monday- call in Augmentin or if symptoms improve than worsen. Codeine cough syrup for symptomatic relief to help him sleep tonight.   BP up slightly-advised return visit when well.   Meds ordered this encounter  . guaiFENesin-codeine 100-10 MG/5ML syrup    Sig: Take  5 mLs by mouth every 6 (six) hours as needed for cough.    Dispense:  120 mL    Refill:  0  . predniSONE (DELTASONE) 20 MG tablet    Sig: Take 2 pills for 3 days, 1 pill for 3 days, 1/2 pill for 3 days, 1/2 pill every other day until finished    Dispense:  12 tablet    Refill:  0

## 2015-07-11 ENCOUNTER — Encounter: Payer: Self-pay | Admitting: Family Medicine

## 2015-07-11 ENCOUNTER — Ambulatory Visit (INDEPENDENT_AMBULATORY_CARE_PROVIDER_SITE_OTHER): Payer: BLUE CROSS/BLUE SHIELD | Admitting: Family Medicine

## 2015-07-11 VITALS — BP 144/90 | HR 82 | Temp 98.1°F | Wt 294.0 lb

## 2015-07-11 DIAGNOSIS — R319 Hematuria, unspecified: Secondary | ICD-10-CM | POA: Diagnosis not present

## 2015-07-11 DIAGNOSIS — I10 Essential (primary) hypertension: Secondary | ICD-10-CM | POA: Diagnosis not present

## 2015-07-11 LAB — COMPREHENSIVE METABOLIC PANEL
ALT: 51 U/L (ref 0–53)
AST: 30 U/L (ref 0–37)
Albumin: 4.1 g/dL (ref 3.5–5.2)
Alkaline Phosphatase: 80 U/L (ref 39–117)
BUN: 10 mg/dL (ref 6–23)
CALCIUM: 9.2 mg/dL (ref 8.4–10.5)
CHLORIDE: 103 meq/L (ref 96–112)
CO2: 31 meq/L (ref 19–32)
Creatinine, Ser: 0.73 mg/dL (ref 0.40–1.50)
GFR: 148.01 mL/min (ref >60.00)
Glucose, Bld: 89 mg/dL (ref 70–99)
POTASSIUM: 3.7 meq/L (ref 3.5–5.1)
Sodium: 141 mEq/L (ref 135–145)
Total Bilirubin: 0.5 mg/dL (ref 0.2–1.2)
Total Protein: 6.9 g/dL (ref 6.0–8.3)

## 2015-07-11 LAB — CBC
HEMATOCRIT: 40.1 % (ref 39.0–52.0)
HEMOGLOBIN: 13.5 g/dL (ref 13.0–17.0)
MCHC: 33.6 g/dL (ref 30.0–36.0)
MCV: 87 fl (ref 78.0–100.0)
PLATELETS: 184 10*3/uL (ref 150.0–400.0)
RBC: 4.61 Mil/uL (ref 4.22–5.81)
RDW: 12.6 % (ref 11.5–15.5)
WBC: 5.3 10*3/uL (ref 4.0–10.5)

## 2015-07-11 LAB — POCT URINALYSIS DIPSTICK
BILIRUBIN UA: NEGATIVE
Glucose, UA: NEGATIVE
KETONES UA: NEGATIVE
Leukocytes, UA: NEGATIVE
NITRITE UA: NEGATIVE
PH UA: 7
Protein, UA: NEGATIVE
Spec Grav, UA: 1.01
Urobilinogen, UA: NEGATIVE

## 2015-07-11 LAB — URINALYSIS, MICROSCOPIC ONLY: RBC / HPF: NONE SEEN (ref 0–?)

## 2015-07-11 LAB — PSA: PSA: 0.34 ng/mL (ref 0.10–4.00)

## 2015-07-11 MED ORDER — LISINOPRIL 20 MG PO TABS: 20.0000 mg | ORAL_TABLET | Freq: Every day | ORAL | Status: DC

## 2015-07-11 NOTE — Assessment & Plan Note (Signed)
S: Poorly controlled. Noncompliant with lisinopril BP Readings from Last 3 Encounters:  07/11/15 144/90  03/05/15 140/90  12/13/12 162/80  A/P: Asked patient to start lisinopril and follow-up within a month. Recheck at that time

## 2015-07-11 NOTE — Patient Instructions (Signed)
With prior kidney stone, this is the most likely cause  We offered repeat CT scan, you declined unless this occurs again. There is a possibility this could represent malignancy but I do not have a strong feeling that is the cause. If it recurs, consider CT scan or urology referral  Bloodwork before you go.   Start taking blood pressure medicine and see me within a month for a recheck on the medicine

## 2015-07-11 NOTE — Progress Notes (Signed)
Daniel Conch, MD  Subjective:  Daniel Rosales is a 49 y.o. year old very pleasant male patient who presents with:  Hematuria -Sharp pain near tip of penis with peeing then started trickling out drops of blood. Lasted just for a minute. Then right side started hurting. Drank a lot of water now feeling better/pain resolved. Happened years ago- in 2012 and had CT scan showing stone.   Also See problem oriented charting  ROS- no penile discharge, no fever . No CVA tenderness. 2 days ago body aching diffusely and cold chills-now resolved  Past Medical History-never smoker Patient Active Problem List   Diagnosis Date Noted  . Hyperglycemia 04/25/2012  . HYPERLIPIDEMIA 06/26/2010  . OBESITY 06/26/2010  . HYPERTENSION 06/26/2010   Medications- reviewed and updated Current Outpatient Prescriptions  Medication Sig Dispense Refill  . lisinopril (PRINIVIL,ZESTRIL) 20 MG tablet Take 1 tablet (20 mg total) by mouth daily. 30 tablet 5   No current facility-administered medications for this visit.    Objective: BP 144/90 mmHg  Pulse 82  Temp(Src) 98.1 F (36.7 C)  Wt 294 lb (133.358 kg) Gen: NAD, resting comfortably CV: RRR no murmurs rubs or gallops Lungs: CTAB no crackles, wheeze, rhonchi Abdomen: soft/nontender/nondistended/normal bowel sounds. No rebound or guarding.  Rectal: normal tone, mildly enlarged prostate (due to obesity-difficult to get full exam though), no masses or tenderness Ext: no edema Skin: warm, dry, no rash Neuro: grossly normal, moves all extremities  Assessment/Plan:  Essential hypertension S: Poorly controlled. Noncompliant with lisinopril BP Readings from Last 3 Encounters:  07/11/15 144/90  03/05/15 140/90  12/13/12 162/80  A/P: Asked patient to start lisinopril and follow-up within a month. Recheck at that time   Hematuria -Gross hematuria, History of kidney stone most likely cause. I discussed with patient I would advise a repeat CT scan at this  time. He declines. He is aware of risk of malignancy. We also discussed urology visit. He is agreeable to potential urology visit if Recurs. Patient does have some mild BPH although I could not get the best rectal exam. Possible prostatic bleed. Patient has never been screened for prostate cancer and he is in agreement to do PSA today. We will also check a CBC and CMET given the bleeding. I doubt UTI but check urine culture. We will also send for microscopic due to confirm true hematuria.   Asked patient to follow-up within a month for blood pressure recheck. Orders Placed This Encounter  Procedures  . Urine culture    [facility]  . Urine Microscopic Only  . CBC    [place]  . Comprehensive metabolic panel    [place]  . PSA  . POC Urinalysis Dipstick    Meds ordered this encounter  Medications  . lisinopril (PRINIVIL,ZESTRIL) 20 MG tablet    Sig: Take 1 tablet (20 mg total) by mouth daily.    Dispense:  30 tablet    Refill:  5

## 2015-07-13 LAB — URINE CULTURE
COLONY COUNT: NO GROWTH
ORGANISM ID, BACTERIA: NO GROWTH

## 2015-09-20 ENCOUNTER — Encounter: Payer: Self-pay | Admitting: Family Medicine

## 2015-09-20 ENCOUNTER — Ambulatory Visit (INDEPENDENT_AMBULATORY_CARE_PROVIDER_SITE_OTHER): Payer: BLUE CROSS/BLUE SHIELD | Admitting: Family Medicine

## 2015-09-20 VITALS — BP 134/88 | Temp 98.6°F | Wt 272.0 lb

## 2015-09-20 DIAGNOSIS — I10 Essential (primary) hypertension: Secondary | ICD-10-CM

## 2015-09-20 DIAGNOSIS — M67911 Unspecified disorder of synovium and tendon, right shoulder: Secondary | ICD-10-CM | POA: Insufficient documentation

## 2015-09-20 DIAGNOSIS — E785 Hyperlipidemia, unspecified: Secondary | ICD-10-CM

## 2015-09-20 DIAGNOSIS — R739 Hyperglycemia, unspecified: Secondary | ICD-10-CM

## 2015-09-20 DIAGNOSIS — Z0000 Encounter for general adult medical examination without abnormal findings: Secondary | ICD-10-CM | POA: Diagnosis not present

## 2015-09-20 MED ORDER — MELOXICAM 15 MG PO TABS: 15.0000 mg | ORAL_TABLET | Freq: Every day | ORAL | Status: DC

## 2015-09-20 NOTE — Progress Notes (Signed)
Daniel ConchStephen Geraldyn Shain, MD Phone: [phone]  Subjective:  Patient presents today for their annual physical. Chief complaint-noted.   See problem oriented charting ROS- full  review of systems was completed and negative except for: shoulder pain on right as noted. No chest pain or shortness of breath with activity  The following were reviewed and entered/updated in epic: Past Medical History  Diagnosis Date  . Hyperlipidemia   . Hypertension    Patient Active Problem List   Diagnosis Date Noted  . Hyperlipidemia 06/26/2010    Priority: Medium  . Essential hypertension 06/26/2010    Priority: Medium  . Hyperglycemia 04/25/2012    Priority: Low  . OBESITY 06/26/2010    Priority: Low  . Tendinopathy of right rotator cuff 09/20/2015   Past Surgical History  Procedure Laterality Date  . Circumcision      Family History  Problem Relation Age of Onset  . Diabetes Father     amputuation  . Hypertension Father   . Kidney disease Sister     renal failure. unclear cause  . Hypertension Sister   . Diabetes type II Sister   . Diabetes type II Mother   . Diabetes type II Brother     Medications- reviewed and updated Current Outpatient Prescriptions  Medication Sig Dispense Refill  . meloxicam (MOBIC) 15 MG tablet Take 1 tablet (15 mg total) by mouth daily. 30 tablet 0   No current facility-administered medications for this visit.    Allergies-reviewed and updated No Known Allergies  Social History   Social History  . Marital Status: Married    Spouse Name: N/A  . Number of Children: N/A  . Years of Education: N/A   Social History Main Topics  . Smoking status: Never Smoker   . Smokeless tobacco: None  . Alcohol Use: 0.0 - 0.6 oz/week    0-1 Standard drinks or equivalent per week  . Drug Use: No  . Sexual Activity: Not Asked   Other Topics Concern  . None   Social History Narrative   Divorced but back together with wife. 2 daughters ([phone] and [phone]).       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 Oropharynx normal Neck: no thyromegaly CV: RRR no murmurs rubs or gallops Lungs: CTAB no crackles, wheeze, rhonchi Abdomen: soft/nontender/nondistended/normal bowel sounds. No rebound or guarding. obese Ext: no edema Skin: warm, dry Neuro: grossly normal, moves all extremities, PERRLA  Prostate exam done last visit  Assessment/Plan:  49 y.o. male presenting for annual physical.  Health Maintenance counseling: 1. Anticipatory guidance: Patient counseled regarding regular dental exams, eye exams, wearing seatbelts.  2. Risk factor reduction:  Advised patient of need for regular exercise and diet rich and fruits and vegetables to reduce risk of heart attack and stroke. 45 minutes with a way of [employer] fitness 5 days a week. Has lost over 20 lbs.  3. Immunizations/screenings/ancillary studies- declines flu  Health Maintenance Due  Topic Date Due  . HIV screening- done through [facility] 05/16/1983   4. Prostate cancer screening- low risk based off PSA and rectal exam 07/11/15- did have some mild BPH  Lab Results  Component Value Date   PSA 0.34 07/11/2015   5. Colon cancer screening - start at age 49  Immunization History  Administered Date(s) Administered  .  Td 06/26/2010   Hyperlipidemia No rx currently. Losing weight at great rate. Recalculate 10 year risk 09/2016.   Essential hypertension S: controlled. Without medication currently but with 22 lbs weight loss  BP Readings from Last 3 Encounters:  09/20/15 134/88  07/11/15 144/90  03/05/15 140/90  A/P:Continue weight loss efforts. Patient hoping for another 20 lbs within a few months. We will check in 6 months from now   Hyperglycemia Strong family history DM. 04/14/12 CBG 105. Other CBGs normal. Weight loss should lower risk.    Tendinopathy of right rotator cuff S: Years of issues with R shoulder but worse over last few months. Seems to be worse in AM. Does ok with golf but overhead activities or behind back has pain. R shoulder pain 7/10 at worst O: normal strength.Pain with Push off back, hawkings test, neer test positive. Empty can no pain or strength issues.  A/P: 10-14 day course of Mobic. Home exercises. Offered ortho or injectoin but patient wants to try conservative first. Will call in if changes mind- gave 1 month as encouragement to call back.    6 month BP and weight check Labs done in September so will wait 1 year to repeat- did not have lipids but will wait on weight loss before checking again   Meds ordered this encounter  Medications  . meloxicam (MOBIC) 15 MG tablet    Sig: Take 1 tablet (15 mg total) by mouth daily.    Dispense:  30 tablet    Refill:  0

## 2015-09-20 NOTE — Assessment & Plan Note (Signed)
S: Years of issues with R shoulder but worse over last few months. Seems to be worse in AM. Does ok with golf but overhead activities or behind back has pain. R shoulder pain 7/10 at worst O: normal strength.Pain with Push off back, hawkings test, neer test positive. Empty can no pain or strength issues.  A/P: 10-14 day course of Mobic. Home exercises. Offered ortho or injectoin but patient wants to try conservative first. Will call in if changes mind- gave 1 month as encouragement to call back.

## 2015-09-20 NOTE — Assessment & Plan Note (Addendum)
Strong family history DM. 04/14/12 CBG 105. Other CBGs normal. Weight loss should lower risk. a1c was not elevated

## 2015-09-20 NOTE — Patient Instructions (Signed)
Rotator Cuff Injury-  Use meloxicam for 10-14 days (may raise blood pressure short term but your pressure is much better) Take it easy on shoulder for next 3 days then start exercises- only do them if mild or less pain. Could also show them to trainer and they can work with you- want no or very very limited weight at first.  Call me if not doing at least 50% better within a month and I can refer to orthopedics- I could also do an injection here if you would like  [place] with weight loss! Blood pressure controlled without medicine today. Let's follow up in 6 months to make sure still doing well. Then in a year do a physical with labs a week prior so we can see how much cholesterol has improved

## 2015-09-20 NOTE — Assessment & Plan Note (Signed)
S: controlled. Without medication currently but with 22 lbs weight loss  BP Readings from Last 3 Encounters:  09/20/15 134/88  07/11/15 144/90  03/05/15 140/90  A/P:Continue weight loss efforts. Patient hoping for another 20 lbs within a few months. We will check in 6 months from now

## 2015-09-20 NOTE — Assessment & Plan Note (Signed)
No rx currently. Losing weight at great rate. Recalculate 10 year risk 09/2016.

## 2015-10-16 ENCOUNTER — Other Ambulatory Visit: Payer: Self-pay | Admitting: Family Medicine

## 2016-07-24 ENCOUNTER — Other Ambulatory Visit (INDEPENDENT_AMBULATORY_CARE_PROVIDER_SITE_OTHER): Payer: Managed Care, Other (non HMO)

## 2016-07-24 DIAGNOSIS — Z0000 Encounter for general adult medical examination without abnormal findings: Secondary | ICD-10-CM | POA: Diagnosis not present

## 2016-07-24 LAB — POC URINALSYSI DIPSTICK (AUTOMATED)
BILIRUBIN UA: NEGATIVE
Blood, UA: NEGATIVE
Glucose, UA: NEGATIVE
KETONES UA: NEGATIVE
LEUKOCYTES UA: NEGATIVE
Nitrite, UA: NEGATIVE
PROTEIN UA: NEGATIVE
Spec Grav, UA: 1.015
Urobilinogen, UA: 0.2
pH, UA: 6.5

## 2016-07-24 LAB — BASIC METABOLIC PANEL
BUN: 7 mg/dL (ref 6–23)
CHLORIDE: 107 meq/L (ref 96–112)
CO2: 28 mEq/L (ref 19–32)
CREATININE: 0.82 mg/dL (ref 0.40–1.50)
Calcium: 9.2 mg/dL (ref 8.4–10.5)
GFR: 128.86 mL/min (ref >60.00)
GLUCOSE: 91 mg/dL (ref 70–99)
POTASSIUM: 4.4 meq/L (ref 3.5–5.1)
Sodium: 143 mEq/L (ref 135–145)

## 2016-07-24 LAB — HEPATIC FUNCTION PANEL
ALT: 21 U/L (ref 0–53)
AST: 15 U/L (ref 0–37)
Albumin: 3.9 g/dL (ref 3.5–5.2)
Alkaline Phosphatase: 86 U/L (ref 39–117)
BILIRUBIN TOTAL: 0.6 mg/dL (ref 0.2–1.2)
Bilirubin, Direct: 0.1 mg/dL (ref 0.0–0.3)
Total Protein: 6.5 g/dL (ref 6.0–8.3)

## 2016-07-24 LAB — CBC WITH DIFFERENTIAL/PLATELET
BASOS PCT: 0.8 % (ref 0.0–3.0)
Basophils Absolute: 0 10*3/uL (ref 0.0–0.1)
EOS PCT: 1.5 % (ref 0.0–5.0)
Eosinophils Absolute: 0.1 10*3/uL (ref 0.0–0.7)
HCT: 40.6 % (ref 39.0–52.0)
Hemoglobin: 13.9 g/dL (ref 13.0–17.0)
LYMPHS ABS: 1.6 10*3/uL (ref 0.7–4.0)
Lymphocytes Relative: 33.4 % (ref 12.0–46.0)
MCHC: 34.2 g/dL (ref 30.0–36.0)
MCV: 86.6 fl (ref 78.0–100.0)
MONO ABS: 0.3 10*3/uL (ref 0.1–1.0)
Monocytes Relative: 6.2 % (ref 3.0–12.0)
NEUTROS PCT: 58.1 % (ref 43.0–77.0)
Neutro Abs: 2.8 10*3/uL (ref 1.4–7.7)
PLATELETS: 161 10*3/uL (ref 150.0–400.0)
RBC: 4.68 Mil/uL (ref 4.22–5.81)
RDW: 12.8 % (ref 11.5–15.5)
WBC: 4.8 10*3/uL (ref 4.0–10.5)

## 2016-07-24 LAB — TSH: TSH: 1.23 u[IU]/mL (ref 0.35–4.50)

## 2016-07-24 LAB — LIPID PANEL
CHOLESTEROL: 195 mg/dL (ref 0–200)
HDL: 43.2 mg/dL (ref >39.00)
LDL CALC: 134 mg/dL — AB (ref 0–99)
NonHDL: 151.97
TRIGLYCERIDES: 91 mg/dL (ref 0.0–149.0)
Total CHOL/HDL Ratio: 5
VLDL: 18.2 mg/dL (ref 0.0–40.0)

## 2016-07-24 LAB — PSA: PSA: 0.28 ng/mL (ref 0.10–4.00)

## 2016-07-31 ENCOUNTER — Encounter: Payer: Self-pay | Admitting: Family Medicine

## 2016-07-31 ENCOUNTER — Ambulatory Visit (INDEPENDENT_AMBULATORY_CARE_PROVIDER_SITE_OTHER): Payer: Managed Care, Other (non HMO) | Admitting: Family Medicine

## 2016-07-31 VITALS — BP 138/88 | HR 73 | Temp 98.3°F | Ht 70.5 in | Wt 250.2 lb

## 2016-07-31 DIAGNOSIS — E785 Hyperlipidemia, unspecified: Secondary | ICD-10-CM | POA: Diagnosis not present

## 2016-07-31 DIAGNOSIS — Z0000 Encounter for general adult medical examination without abnormal findings: Secondary | ICD-10-CM | POA: Diagnosis not present

## 2016-07-31 NOTE — Patient Instructions (Addendum)
Things look great Wt Readings from Last 3 Encounters:  07/31/16 250 lb 3.2 oz (113.5 kg)  09/20/15 272 lb (123.4 kg)  07/11/15 294 lb (133.4 kg)   You are doing all the work! And luckily we dont have to start any medicines at this point. Blood pressure close to point where medicine may help but lets keep it up with goal of 200 lbs and see how things go.   Keep an eye on blood pressure and if you are noting several over 140/90

## 2016-07-31 NOTE — Progress Notes (Signed)
Pre visit review using our clinic review tool, if applicable. No additional management support is needed unless otherwise documented below in the visit note. 

## 2016-07-31 NOTE — Progress Notes (Signed)
Phone: [phone]  Subjective:  Patient presents today for their annual physical. Chief complaint-noted.   See problem oriented charting- ROS- full  review of systems was completed and negative including No chest pain or shortness of breath. No headache or blurry vision.   The following were reviewed and entered/updated in epic: Past Medical History:  Diagnosis Date  . Hyperlipidemia   . Hypertension    Patient Active Problem List   Diagnosis Date Noted  . Hyperlipidemia 06/26/2010    Priority: Medium  . Essential hypertension 06/26/2010    Priority: Medium  . Hyperglycemia 04/25/2012    Priority: Low  . OBESITY 06/26/2010    Priority: Low  . Tendinopathy of right rotator cuff 09/20/2015   Past Surgical History:  Procedure Laterality Date  . CIRCUMCISION      Family History  Problem Relation Age of Onset  . Diabetes Father     amputuation  . Hypertension Father   . Kidney disease Sister     renal failure. unclear cause  . Hypertension Sister   . Diabetes type II Sister   . Diabetes type II Mother   . Diabetes type II Brother     Medications- reviewed and updated No current outpatient prescriptions on file.   No current facility-administered medications for this visit.     Allergies-reviewed and updated No Known Allergies  Social History   Social History  . Marital status: Married    Spouse name: N/A  . Number of children: N/A  . Years of education: N/A   Social History Main Topics  . Smoking status: Never Smoker  . Smokeless tobacco: None  . Alcohol use 0.0 - 0.6 oz/week  . Drug use: No  . Sexual activity: Not Asked   Other Topics Concern  . None   Social History Narrative   Divorced but back together with wife. 2 daughters (23 and 61).       Works for time Multimedia programmer: enjoys golf    Objective: BP 138/88   Pulse 73   Temp 98.3 F (36.8 C) (Oral)   Ht 5' 10.5" (1.791 m)   Wt 250 lb 3.2 oz (113.5 kg)   SpO2 97%   BMI  35.39 kg/m  Gen: NAD, resting comfortably HEENT: Mucous membranes are moist. Oropharynx normal Neck: no thyromegaly CV: RRR no murmurs rubs or gallops Lungs: CTAB no crackles, wheeze, rhonchi Abdomen: soft/nontender/nondistended/normal bowel sounds. No rebound or guarding.  Ext: no edema Skin: warm, dry Neuro: grossly normal, moves all extremities, PERRLA Rectal: normal tone, diffusely enlarged prostate, no masses or tenderness  Assessment/Plan:  50 y.o. male presenting for annual physical.  Health Maintenance counseling: 1. Anticipatory guidance: Patient counseled regarding regular dental exams, eye exams (goes yearly), wearing seatbelts.  2. Risk factor reduction:  Advised patient of need for regular exercise and diet rich and fruits and vegetables to reduce risk of heart attack and stroke. Going to [employer]- continues with improved diet.  3. Immunizations/screenings/ancillary studies- declines flu shot Immunization History  Administered Date(s) Administered  . Td 06/26/2010  4. Prostate cancer screening- low risk based off PSA trend and rectal. Does have some BPH- oddly with low PSA. Nocturia twice a night usually Lab Results  Component Value Date   PSA 0.28 07/24/2016   PSA 0.34 07/11/2015   5. Colon cancer screening - no family history or rectal bleeding or melena- start age 86  Status of chronic or acute concerns  HLD- mild elevation  of lipids even despite weight losswith 10 year risk 5.7% with initial BP, on repeat BP  Lab Results  Component Value Date   CHOL 195 07/24/2016   HDL 43.20 07/24/2016   LDLCALC 134 (H) 07/24/2016   LDLDIRECT 155.3 10/26/2012   TRIG 91.0 07/24/2016   CHOLHDL 5 07/24/2016   HTN- controlled after weight loss BP Readings from Last 3 Encounters:  07/31/16 138/88  09/20/15 134/88  07/11/15 (!) 144/90   Hyperglycemia- fasting sugar now without elevation for 2 years after 20 lbs weight loss in 2016  Wt Readings from Last 3 Encounters:    07/31/16 250 lb 3.2 oz (113.5 kg)  09/20/15 272 lb (123.4 kg)  07/11/15 294 lb (133.4 kg)  his goal is to get down to 200 lbs.   Return in about 1 year (around 07/31/2017) for physical.  Return precautions advised.   Tana ConchStephen Hunter, MD

## 2018-06-24 ENCOUNTER — Ambulatory Visit: Payer: Managed Care, Other (non HMO) | Admitting: Family Medicine

## 2018-06-29 ENCOUNTER — Encounter: Payer: Self-pay | Admitting: Gastroenterology

## 2018-06-29 ENCOUNTER — Ambulatory Visit (INDEPENDENT_AMBULATORY_CARE_PROVIDER_SITE_OTHER): Payer: Managed Care, Other (non HMO) | Admitting: Family Medicine

## 2018-06-29 ENCOUNTER — Encounter: Payer: Self-pay | Admitting: Family Medicine

## 2018-06-29 DIAGNOSIS — N529 Male erectile dysfunction, unspecified: Secondary | ICD-10-CM | POA: Diagnosis not present

## 2018-06-29 DIAGNOSIS — I10 Essential (primary) hypertension: Secondary | ICD-10-CM

## 2018-06-29 DIAGNOSIS — Z1211 Encounter for screening for malignant neoplasm of colon: Secondary | ICD-10-CM

## 2018-06-29 MED ORDER — SILDENAFIL CITRATE 100 MG PO TABS: 100.0000 mg | ORAL_TABLET | Freq: Every day | ORAL | 11 refills | Status: DC | PRN

## 2018-06-29 MED ORDER — SILDENAFIL CITRATE 20 MG PO TABS: ORAL_TABLET | ORAL | 3 refills | Status: DC

## 2018-06-29 NOTE — Assessment & Plan Note (Addendum)
S: controlled on no rx- as long as keeps weight below his max of near 300 BP Readings from Last 3 Encounters:  06/29/18 116/88  07/31/16 138/88  09/20/15 134/88  A/P: We discussed blood pressure goal of <140/90. Continue without meds and focus on weight loss

## 2018-06-29 NOTE — Assessment & Plan Note (Addendum)
S: Weight up 8 lbs but still down about 30 lbs from peak. He would like to get to 200 lbs but feels like he is at a standstill. Craves foods heavily. He is swimming daily basis for an hour. Doing meal plan with baked chicken and vegetables for 1 big meal a day at lunch and then doesn't eat anything after 6 pm and also skips breakfast. But other than that does some vending machine type eating. May end up grabbing a juice or ginger ale to get some calories/energy. Goal of 200 lbs. Also reports Fruit smoothies with some natural herbs as well  BMI >35 with hypertension and hyperlipidemia history A/P: Patient reports eating well and having trouble losing weight due to food cravings. When questioned further he is trying to do 1 big meal a day then fighting off hunger with what dont sound to be the healthiest snacks. We discussed that 1 meal a day could possibly slow metabolism and make weight loss more difficult. He would like a medication to help curb cravings. I told him a good first step would be a full visit with Jarold Motto, PA and RD and if unable to make progress could consider medication that worked for his wife (phentermine- though I would use the version with topamax)

## 2018-06-29 NOTE — Progress Notes (Signed)
Subjective:  Daniel Rosales is a 52 y.o. year old very pleasant male patient who presents for/with See problem oriented charting ROS- No chest pain or shortness of breath. No headache or blurry vision.    Past Medical History-  Patient Active Problem List   Diagnosis Date Noted  . Hyperlipidemia 06/26/2010    Priority: Medium  . Essential hypertension 06/26/2010    Priority: Medium  . Hyperglycemia 04/25/2012    Priority: Low  . Morbid obesity (HCC) 06/26/2010    Priority: Low  . Erectile dysfunction 06/29/2018  . Tendinopathy of right rotator cuff 09/20/2015    Medications- reviewed and updated Current Outpatient Medications  Medication Sig Dispense Refill  . sildenafil (REVATIO) 20 MG tablet Either sildenafil OR viagra- whichever cheaper. Take 2-5 tablets as needed once every 48 hours for erectile dysfunction 50 tablet 3  . sildenafil (VIAGRA) 100 MG tablet Take 1 tablet (100 mg total) by mouth daily as needed for erectile dysfunction (Either sildenafil OR viagra- whichever is cheaper). 10 tablet 11   No current facility-administered medications for this visit.     Objective: BP 116/88 (BP Location: Left Arm, Patient Position: Sitting, Cuff Size: Large)   Pulse 65   Temp 98.4 F (36.9 C) (Oral)   Ht 5' 10.5" (1.791 m)   Wt 258 lb 9.6 oz (117.3 kg)   SpO2 98%   BMI 36.58 kg/m  Gen: NAD, resting comfortably CV: RRR no murmurs rubs or gallops Lungs: CTAB no crackles, wheeze, rhonchi Abdomen: soft/nontender/ obese Ext: no edema Skin: warm, dry   Assessment/Plan:  Other notes: 1. Physical next month  Morbid obesity (HCC) S: Weight up 8 lbs but still down about 30 lbs from peak. He would like to get to 200 lbs but feels like he is at a standstill. Craves foods heavily. He is swimming daily basis for an hour. Doing meal plan with baked chicken and vegetables for 1 big meal a day at lunch and then doesn't eat anything after 6 pm and also skips breakfast. But other than  that does some vending machine type eating. May end up grabbing a juice or ginger ale to get some calories/energy. Goal of 200 lbs. Also reports Fruit smoothies with some natural herbs as well  BMI >35 with hypertension and hyperlipidemia history A/P: Patient reports eating well and having trouble losing weight due to food cravings. When questioned further he is trying to do 1 big meal a day then fighting off hunger with what dont sound to be the healthiest snacks. We discussed that 1 meal a day could possibly slow metabolism and make weight loss more difficult. He would like a medication to help curb cravings. I told him a good first step would be a full visit with Jarold Motto, PA and RD and if unable to make progress could consider medication that worked for his wife (phentermine- though I would use the version with topamax)  Essential hypertension S: controlled on no rx- as long as keeps weight below his max of near 300 BP Readings from Last 3 Encounters:  06/29/18 116/88  07/31/16 138/88  09/20/15 134/88  A/P: We discussed blood pressure goal of <140/90. Continue without meds and focus on weight loss    Erectile dysfunction S: reports issues with erections for at least a few months. Has continued to swim/lose exercise but has been hard for him to lose weight (see obesity section). He thinks if he could lose more weight and remain active may help A/P:  I agree weight loss worth a trial. With that being said in meantime did rx sildenafil or viagra for him to trial     Future Appointments  Date Time Provider Department Center  07/04/2018  1:00 PM Jarold Motto, [place] [facility]  08/02/2018  2:15 PM Shelva Majestic, [place] [facility]  08/03/2018  3:30 PM [facility] RM[facility]  08/17/2018 11:30 AM Tressia Danas, MD [facility]  Lab/Order associations: Screen for colon cancer - Plan: Ambulatory referral to Gastroenterology  Morbid obesity  [facility])  Essential hypertension  Erectile dysfunction, unspecified erectile dysfunction type  Meds ordered this encounter  Medications  . sildenafil (VIAGRA) 100 MG tablet    Sig: Take 1 tablet (100 mg total) by mouth daily as needed for erectile dysfunction (Either sildenafil OR viagra- whichever is cheaper).    Dispense:  10 tablet    Refill:  11  . sildenafil (REVATIO) 20 MG tablet    Sig: Either sildenafil OR viagra- whichever cheaper. Take 2-5 tablets as needed once every 48 hours for erectile dysfunction    Dispense:  50 tablet    Refill:  3   Return precautions advised.  Tana Conch, MD

## 2018-06-29 NOTE — Patient Instructions (Addendum)
Health Maintenance Due  Topic Date Due  . COLONOSCOPY -We will call you within two weeks about your referral to GI for colonoscopy. If you do not hear within 3 weeks, give [place] a call.   05/15/2018   Schedule a visit with Jarold Motto, PA and RD- this should be an hour long visit for nutrition counseling. Team please get him the homework for this.   Trial  Viagra 50mg  of 100mg  OR silidenafil 20mg  (which you would use likely 40mg  to start and up to 100mg ). Do NOT take these together- take whichever is cheaper

## 2018-06-29 NOTE — Assessment & Plan Note (Signed)
S: reports issues with erections for at least a few months. Has continued to swim/lose exercise but has been hard for him to lose weight (see obesity section). He thinks if he could lose more weight and remain active may help A/P: I agree weight loss worth a trial. With that being said in meantime did rx sildenafil or viagra for him to trial

## 2018-06-30 ENCOUNTER — Telehealth: Payer: Self-pay | Admitting: Family Medicine

## 2018-06-30 NOTE — Telephone Encounter (Signed)
Copied from CRM [phone]. Topic: Quick Communication - See Telephone Encounter >> Jun 30, 2018 11:48 AM Lorrine KinMcGee, Jayda White B, NT wrote: CRM for notification. See Telephone encounter for: 06/30/18. Patietn calling and states that the pharmacy is needing a prior authorization on the sildenafil. Please advise

## 2018-07-01 NOTE — Telephone Encounter (Signed)
Pt calling to check status  °

## 2018-07-04 ENCOUNTER — Encounter: Payer: Self-pay | Admitting: Physician Assistant

## 2018-07-04 ENCOUNTER — Ambulatory Visit (INDEPENDENT_AMBULATORY_CARE_PROVIDER_SITE_OTHER): Payer: Managed Care, Other (non HMO) | Admitting: Physician Assistant

## 2018-07-04 VITALS — BP 150/90 | HR 80 | Temp 98.7°F | Ht 70.5 in | Wt 256.0 lb

## 2018-07-04 DIAGNOSIS — Z713 Dietary counseling and surveillance: Secondary | ICD-10-CM

## 2018-07-04 NOTE — Patient Instructions (Signed)
It was great to see you!  -Start preparing fruit smoothies with protein powder. -Ideas -- boiled eggs, trail mix, tuna pouches, fresh fruit -Watch your portions!! -Push the vegetables -- you can eat as many of the non-starchy vegetables that you like -Keep up drinking plenty of water!! You are doing great.  Take care,  Jarold MottoSamantha Rafeef Lau PA-C

## 2018-07-04 NOTE — Progress Notes (Signed)
Daniel BoutonBrian L Rosales is a 52 y.o. male here for Nutrition Consult.  I acted as a Neurosurgeonscribe for Energy East CorporationSamantha Kipp Shank, PA-C [name], LPN  History of Present Illness:   Chief Complaint  Patient presents with  . Nutrition Counseling    HPI  Patient is here to discuss Nutrition and Diet. Pt would like to lose 50 lbs, would like to weigh 200 lbs. Pt is swimming 3 x's a week for 60 minutes. Approximately 1 year ago, he did a 21 day juice cleanse and AWOL gym --> got down to 275 lb after this.   Wife does grocery shopping and meal prep.  Dietary recall: Wakes up at [employer] Breakfast -- 9am, fruit Lunch -- 12p, peanut butter crackers Dinner -- 5p, grilled chicken and salad Snacks -- no snacks after dinner Beverages  -- 20 oz gingerale x 2 week Alcohol -- very rare  Weight: Wt Readings from Last 3 Encounters:  07/04/18 256 lb (116.1 kg)  06/29/18 258 lb 9.6 oz (117.3 kg)  07/31/16 250 lb 3.2 oz (113.5 kg)    Exercise: Swimming 3 days a week x 1 hour. Has gotten into the swimming for past 3 months. Also does sauna for 10 min. Has a physical job. Gets 15,000 steps daily.  Support system: Wife -- current accountability partner  Sleep: Doesn't sleep well when traveling  Goals: 1- weight 200 lb 2- eat more balanced meals 3- have more energy throughout the day  Estimated daily energy needs: Calories: 1700-1850 kcal Protein: 70-85 g Fluid: at least 2000 ml  Past Medical History:  Diagnosis Date  . Hyperlipidemia   . Hypertension      Social History   Socioeconomic History  . Marital status: Married    Spouse name: Not on file  . Number of children: Not on file  . Years of education: Not on file  . Highest education level: Not on file  Occupational History  . Not on file  Social Needs  . Financial resource strain: Not on file  . Food insecurity:    Worry: Not on file    Inability: Not on file  . Transportation needs:    Medical: Not on file    Non-medical: Not on file   Tobacco Use  . Smoking status: Never Smoker  . Smokeless tobacco: Never Used  Substance and Sexual Activity  . Alcohol use: Yes    Alcohol/week: 0.0 - 1.0 standard drinks  . Drug use: No  . Sexual activity: Not on file  Lifestyle  . Physical activity:    Days per week: Not on file    Minutes per session: Not on file  . Stress: Not on file  Relationships  . Social connections:    Talks on phone: Not on file    Gets together: Not on file    Attends religious service: Not on file    Active member of club or organization: Not on file    Attends meetings of clubs or organizations: Not on file    Relationship status: Not on file  . Intimate partner violence:    Fear of current or ex partner: Not on file    Emotionally abused: Not on file    Physically abused: Not on file    Forced sexual activity: Not on file  Other Topics Concern  . Not on file  Social History Narrative   Divorced but back together with wife. 2 daughters ([id number] and [id number]).       Works for  [employer]      Hobbies: enjoys golf    Past Surgical History:  Procedure Laterality Date  . CIRCUMCISION      Family History  Problem Relation Age of Onset  . Diabetes Father        amputuation  . Hypertension Father   . Kidney disease Sister        renal failure. unclear cause  . Hypertension Sister   . Diabetes type II Sister   . Diabetes type II Mother   . Diabetes type II Brother     No Known Allergies  Current Medications:   Current Outpatient Medications:  .  sildenafil (REVATIO) 20 MG tablet, Either sildenafil OR viagra- whichever cheaper. Take 2-5 tablets as needed once every 48 hours for erectile dysfunction (Patient not taking: Reported on 07/04/2018), Disp: 50 tablet, Rfl: 3 .  sildenafil (VIAGRA) 100 MG tablet, Take 1 tablet (100 mg total) by mouth daily as needed for erectile dysfunction (Either sildenafil OR viagra- whichever is cheaper). (Patient not taking: Reported on 07/04/2018), Disp: 10 tablet,  Rfl: 11   Review of Systems:   ROS  Negative unless otherwise specified per HPI.  Vitals:   Vitals:   07/04/18 1308  BP: (!) 150/90  Pulse: 80  Temp: 98.7 F (37.1 C)  TempSrc: Oral  SpO2: 97%  Weight: 256 lb (116.1 kg)  Height: 5' 10.5" (1.791 m)     Body mass index is 36.21 kg/m.  Physical Exam:   Physical Exam  Constitutional: He is oriented to person, place, and time. He appears well-developed and well-nourished.  HENT:  Head: Normocephalic.  Eyes: Pupils are equal, round, and reactive to light. Conjunctivae and EOM are normal.  Neck: Normal range of motion.  Pulmonary/Chest: Effort normal.  Musculoskeletal: Normal range of motion.  Neurological: He is alert and oriented to person, place, and time.  Skin: Skin is warm and dry.  Psychiatric: He has a normal mood and affect. His behavior is normal. Judgment and thought content normal.  Nursing note and vitals reviewed.   Assessment and Plan:    Chia was seen today for nutrition counseling.  Diagnoses and all orders for this visit:  Encounter for nutritional counseling  Morbid obesity (HCC)   Discussed specific, individualized recommendations regarding nutrition including decreasing sugary beverages, limiting portions, balancing out meals, and eating regularly throughout the day. Handouts provided included: Balanced Plate, Balanced Snack List, Balanced Breakfast, 1800 Calorie Sample Menus. Provided emotional support and encouraged slow, steady weight loss. Patient's questions answered throughout encounter. Follow-up with me prn.  . Reviewed expectations re: course of current medical issues. . Discussed self-management of symptoms. . Outlined signs and symptoms indicating need for more acute intervention. . Patient verbalized understanding and all questions were answered. . See orders for this visit as documented in the electronic medical record. . Patient received an After-Visit Summary.  CMA or LPN  served as scribe during this visit. History, Physical, and Plan performed by medical provider. Documentation and orders reviewed and attested to.  Jarold Motto, PA-C

## 2018-07-05 ENCOUNTER — Telehealth: Payer: Self-pay

## 2018-07-05 NOTE — Telephone Encounter (Signed)
Prior authorization was submitted and approval was received. I called the pharmacy to let them know. They had filled the prescription. 50 tablets is $29. I called Arlys JohnBrian and left a voicemail message to let him know. I provided a call back number for questions.

## 2018-07-05 NOTE — Telephone Encounter (Signed)
  Submitted Prior Authorization for sildenafil (VIAGRA) 100 MG tablet   CaseId:[id number];Status:Approved;Review Type:Prior Auth;Coverage Start Date:06/05/2018;Coverage End Date:07/05/2019;

## 2018-07-25 ENCOUNTER — Encounter: Payer: Managed Care, Other (non HMO) | Admitting: Family Medicine

## 2018-07-27 ENCOUNTER — Telehealth: Payer: Self-pay | Admitting: Family Medicine
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
## 2018-08-02 ENCOUNTER — Encounter: Payer: Managed Care, Other (non HMO) | Admitting: Family Medicine

## 2018-08-03 ENCOUNTER — Ambulatory Visit (AMBULATORY_SURGERY_CENTER): Payer: Self-pay | Admitting: *Deleted

## 2018-08-03 VITALS — Ht 71.0 in | Wt 261.0 lb

## 2018-08-03 DIAGNOSIS — Z1211 Encounter for screening for malignant neoplasm of colon: Secondary | ICD-10-CM

## 2018-08-03 NOTE — Progress Notes (Signed)
Patient denies any allergies to eggs or soy. Patient denies any problems with anesthesia/sedation. Patient denies any oxygen use at home. Patient denies taking any diet/weight loss medications or blood thinners. EMMI education assisgned to patient on colonoscopy, this was explained and instructions given to patient. 

## 2018-08-04 ENCOUNTER — Encounter: Payer: Self-pay | Admitting: Gastroenterology

## 2018-08-04 NOTE — Telephone Encounter (Signed)
Will you please contact pt and schedule before the end of the year.. Will need over ride access.

## 2018-08-04 NOTE — Telephone Encounter (Signed)
im ok trying to work him iin- need to work through people being rescheduled from December 10th first though

## 2018-08-04 NOTE — Telephone Encounter (Signed)
Are you okay with working pt in before the end of the year?

## 2018-08-05 NOTE — Telephone Encounter (Signed)
Called patient left vm reschedule CPE

## 2018-08-17 ENCOUNTER — Ambulatory Visit (AMBULATORY_SURGERY_CENTER): Payer: Managed Care, Other (non HMO) | Admitting: Gastroenterology

## 2018-08-17 ENCOUNTER — Encounter: Payer: Self-pay | Admitting: Gastroenterology

## 2018-08-17 VITALS — BP 122/84 | HR 60 | Temp 98.2°F | Resp 13 | Ht 70.0 in | Wt 256.0 lb

## 2018-08-17 DIAGNOSIS — Z1211 Encounter for screening for malignant neoplasm of colon: Secondary | ICD-10-CM | POA: Diagnosis not present

## 2018-08-17 MED ORDER — SODIUM CHLORIDE 0.9 % IV SOLN: 500.0000 mL | Freq: Once | INTRAVENOUS | Status: DC

## 2018-08-17 NOTE — Patient Instructions (Addendum)
  Thank you for allowing [place] to care for you today!  Resume previous diet and medications today.  Return to normal activities tomorrow.  Handout provided for diverticulosis and high fiber diet.  Repeat colonoscopy in 10 years for screening.    YOU HAD AN ENDOSCOPIC PROCEDURE TODAY AT THE [facility]:   Refer to the procedure report that was given to you for any specific questions about what was found during the examination.  If the procedure report does not answer your questions, please call your gastroenterologist to clarify.  If you requested that your care partner not be given the details of your procedure findings, then the procedure report has been included in a sealed envelope for you to review at your convenience later.  YOU SHOULD EXPECT: Some feelings of bloating in the abdomen. Passage of more gas than usual.  Walking can help get rid of the air that was put into your GI tract during the procedure and reduce the bloating. If you had a lower endoscopy (such as a colonoscopy or flexible sigmoidoscopy) you may notice spotting of blood in your stool or on the toilet paper. If you underwent a bowel prep for your procedure, you may not have a normal bowel movement for a few days.  Please Note:  You might notice some irritation and congestion in your nose or some drainage.  This is from the oxygen used during your procedure.  There is no need for concern and it should clear up in a day or so.  SYMPTOMS TO REPORT IMMEDIATELY:   Following lower endoscopy (colonoscopy or flexible sigmoidoscopy):  Excessive amounts of blood in the stool  Significant tenderness or worsening of abdominal pains  Swelling of the abdomen that is new, acute  Fever of 100F or higher   For urgent or emergent issues, a gastroenterologist can be reached at any hour by calling [phone].   DIET:  We do recommend a small meal at first, but then you may proceed to your regular diet.  Drink plenty  of fluids but you should avoid alcoholic beverages for 24 hours.  ACTIVITY:  You should plan to take it easy for the rest of today and you should NOT DRIVE or use heavy machinery until tomorrow (because of the sedation medicines used during the test).    FOLLOW UP: Our staff will call the number listed on your records the next business day following your procedure to check on you and address any questions or concerns that you may have regarding the information given to you following your procedure. If we do not reach you, we will leave a message.  However, if you are feeling well and you are not experiencing any problems, there is no need to return our call.  We will assume that you have returned to your regular daily activities without incident.  If any biopsies were taken you will be contacted by phone or by letter within the next 1-3 weeks.  Please call us at [phone] if you have not heard about the biopsies in 3 weeks.    SIGNATURES/CONFIDENTIALITY: You and/or your care partner have signed paperwork which will be entered into your electronic medical record.  These signatures attest to the fact that that the information above on your After Visit Summary has been reviewed and is understood.  Full responsibility of the confidentiality of this discharge information lies with you and/or your care-partner.

## 2018-08-17 NOTE — Progress Notes (Signed)
Pt's states no medical or surgical changes since previsit or office visit. 

## 2018-08-17 NOTE — Progress Notes (Signed)
PT taken to PACU. Monitors in place. VSS. Report given to RN. 

## 2018-08-17 NOTE — Op Note (Signed)
[facility] Patient Name: Daniel Rosales Procedure Date: 08/17/2018 12:04 PM MRN: [id number] Endoscopist: Tressia Danas MD, MD Age: 52 Referring MD:  Date of Birth: Aug 20, 1966 Gender: Male Account #: [id number] Procedure:                Colonoscopy Indications:              Screening for colorectal malignant neoplasm. No                            family history of colon cancer or polyps. No                            baseline GI symptoms. Medicines:                See the Anesthesia note for documentation of the                            administered medications Procedure:                Pre-Anesthesia Assessment:                           - Prior to the procedure, a History and Physical                            was performed, and patient medications and                            allergies were reviewed. The patient's tolerance of                            previous anesthesia was also reviewed. The risks                            and benefits of the procedure and the sedation                            options and risks were discussed with the patient.                            All questions were answered, and informed consent                            was obtained. Prior Anticoagulants: The patient has                            taken no previous anticoagulant or antiplatelet                            agents. ASA Grade Assessment: II - A patient with                            mild systemic disease. After reviewing the risks  and benefits, the patient was deemed in                            satisfactory condition to undergo the procedure.                           After obtaining informed consent, the colonoscope                            was passed under direct vision. Throughout the                            procedure, the patient's blood pressure, pulse, and                            oxygen saturations were monitored  continuously. The                            Colonoscope was introduced through the anus and                            advanced to the the terminal ileum, with                            identification of the appendiceal orifice and IC                            valve. The colonoscopy was performed without                            difficulty. The patient tolerated the procedure                            well. The quality of the bowel preparation was good. Scope In: 12:11:36 PM Scope Out: 12:26:08 PM Scope Withdrawal Time: 0 hours 10 minutes 2 seconds  Total Procedure Duration: 0 hours 14 minutes 32 seconds  Findings:                 The perianal and digital rectal examinations were                            normal.                           A few medium-mouthed diverticula were found in the                            sigmoid colon and descending colon.                           The exam was otherwise without abnormality on                            direct and retroflexion views. Complications:  No immediate complications. Estimated Blood Loss:     Estimated blood loss: none. Estimated blood loss:                            none. Impression:               - The entire examined colon is normal on direct and                            retroflexion views.                           - Mild left-sided diverticulosis.                           - No specimens collected. Recommendation:           - Discharge patient to home.                           - Resume previous diet. High-fiber diet recommended                           - Continue present medications.                           - Repeat colonoscopy in 10 years for screening                            purposes based on current colon cancer screening                            guidelines. Repeat colonoscopy recommended prior to                            that time with the development of any new, interval                             symptoms. Tressia Danas MD, MD 08/17/2018 12:33:22 PM This report has been signed electronically.

## 2018-08-18 ENCOUNTER — Telehealth: Payer: Self-pay | Admitting: *Deleted

## 2018-08-18 ENCOUNTER — Telehealth: Payer: Self-pay

## 2018-08-18 NOTE — Telephone Encounter (Signed)
  Follow up Call-  Call back number 08/17/2018  Post procedure Call Back phone  # [phone]  Permission to leave phone message Yes  Some recent data might be hidden     No answer

## 2018-08-18 NOTE — Telephone Encounter (Signed)
Message left

## 2018-10-27 ENCOUNTER — Encounter: Payer: Self-pay | Admitting: Family Medicine

## 2018-10-27 ENCOUNTER — Ambulatory Visit (INDEPENDENT_AMBULATORY_CARE_PROVIDER_SITE_OTHER): Payer: BLUE CROSS/BLUE SHIELD | Admitting: Family Medicine

## 2018-10-27 VITALS — BP 140/78 | HR 86 | Temp 98.7°F | Ht 70.0 in | Wt 246.0 lb

## 2018-10-27 DIAGNOSIS — Z125 Encounter for screening for malignant neoplasm of prostate: Secondary | ICD-10-CM

## 2018-10-27 DIAGNOSIS — Z0000 Encounter for general adult medical examination without abnormal findings: Secondary | ICD-10-CM

## 2018-10-27 DIAGNOSIS — Z6835 Body mass index (BMI) 35.0-35.9, adult: Secondary | ICD-10-CM

## 2018-10-27 DIAGNOSIS — E785 Hyperlipidemia, unspecified: Secondary | ICD-10-CM | POA: Diagnosis not present

## 2018-10-27 DIAGNOSIS — I10 Essential (primary) hypertension: Secondary | ICD-10-CM

## 2018-10-27 DIAGNOSIS — N529 Male erectile dysfunction, unspecified: Secondary | ICD-10-CM

## 2018-10-27 LAB — COMPREHENSIVE METABOLIC PANEL
ALK PHOS: 87 U/L (ref 39–117)
ALT: 18 U/L (ref 0–53)
AST: 14 U/L (ref 0–37)
Albumin: 4.5 g/dL (ref 3.5–5.2)
BILIRUBIN TOTAL: 0.9 mg/dL (ref 0.2–1.2)
BUN: 7 mg/dL (ref 6–23)
CHLORIDE: 102 meq/L (ref 96–112)
CO2: 28 meq/L (ref 19–32)
Calcium: 10 mg/dL (ref 8.4–10.5)
Creatinine, Ser: 0.85 mg/dL (ref 0.40–1.50)
GFR: 122.48 mL/min (ref >60.00)
GLUCOSE: 91 mg/dL (ref 70–99)
Potassium: 3.6 mEq/L (ref 3.5–5.1)
Sodium: 139 mEq/L (ref 135–145)
Total Protein: 7 g/dL (ref 6.0–8.3)

## 2018-10-27 LAB — PSA: PSA: 0.38 ng/mL (ref 0.10–4.00)

## 2018-10-27 LAB — CBC
HCT: 43.9 % (ref 39.0–52.0)
Hemoglobin: 15.1 g/dL (ref 13.0–17.0)
MCHC: 34.5 g/dL (ref 30.0–36.0)
MCV: 87.9 fl (ref 78.0–100.0)
Platelets: 229 10*3/uL (ref 150.0–400.0)
RBC: 4.99 Mil/uL (ref 4.22–5.81)
RDW: 12.7 % (ref 11.5–15.5)
WBC: 7.9 10*3/uL (ref 4.0–10.5)

## 2018-10-27 LAB — LIPID PANEL
Cholesterol: 219 mg/dL — ABNORMAL HIGH (ref 0–200)
HDL: 47.3 mg/dL (ref >39.00)
LDL Cholesterol: 157 mg/dL — ABNORMAL HIGH (ref 0–99)
NonHDL: 171.92
TRIGLYCERIDES: 73 mg/dL (ref 0.0–149.0)
Total CHOL/HDL Ratio: 5
VLDL: 14.6 mg/dL (ref 0.0–40.0)

## 2018-10-27 MED ORDER — SILDENAFIL CITRATE 20 MG PO TABS: ORAL_TABLET | ORAL | 5 refills | Status: DC

## 2018-10-27 NOTE — Assessment & Plan Note (Signed)
Trial of erectile dysfunction medication was helpful- sildenafil

## 2018-10-27 NOTE — Progress Notes (Signed)
Phone: [phone]  Subjective:  Patient presents today for their annual physical. Chief complaint-noted.   See problem oriented charting- ROS- full  review of systems was completed and negative except for: Erectile dysfunction, weight loss but intentional  The following were reviewed and entered/updated in epic: Past Medical History:  Diagnosis Date  . Hyperlipidemia   . Hypertension    Patient Active Problem List   Diagnosis Date Noted  . Hyperlipidemia 06/26/2010    Priority: Medium  . Essential hypertension 06/26/2010    Priority: Medium  . Hyperglycemia 04/25/2012    Priority: Low  . Morbid obesity (HCC) 06/26/2010    Priority: Low  . Erectile dysfunction 06/29/2018  . Tendinopathy of right rotator cuff 09/20/2015   Past Surgical History:  Procedure Laterality Date  . CIRCUMCISION  at age 84    Family History  Problem Relation Age of Onset  . Diabetes type II Mother   . Diabetes Father        amputuation  . Hypertension Father   . Kidney disease Sister        renal failure. unclear cause  . Hypertension Sister   . Diabetes type II Sister   . Diabetes type II Brother   . Colon cancer Neg Hx   . Esophageal cancer Neg Hx   . Stomach cancer Neg Hx   . Rectal cancer Neg Hx   . Colon polyps Neg Hx     Medications- reviewed and updated Current Outpatient Medications  Medication Sig Dispense Refill  . sildenafil (REVATIO) 20 MG tablet Either sildenafil OR viagra- whichever cheaper. Take 2-5 tablets as needed once every 48 hours for erectile dysfunction 50 tablet 5  . UNABLE TO FIND Med Name: herbal supplement sea moss/berdock root/bladderact     No current facility-administered medications for this visit.     Allergies-reviewed and updated No Known Allergies  Social History   Social History Narrative   Divorced but back together with wife. 2 daughters (23 and 83).       Works for time Multimedia programmer: enjoys golf    Objective: BP 140/78 (BP  Location: Left Arm, Cuff Size: Large)   Pulse 86   Temp 98.7 F (37.1 C) (Oral)   Ht 5\' 10"  (1.778 m)   Wt 246 lb (111.6 kg)   SpO2 96%   BMI 35.30 kg/m  Gen: NAD, resting comfortably HEENT: Mucous membranes are moist. Oropharynx normal Neck: no thyromegaly CV: RRR no murmurs rubs or gallops Lungs: CTAB no crackles, wheeze, rhonchi Abdomen: soft/nontender/nondistended/normal bowel sounds. No rebound or guarding. Obesity continues to improve Ext: no edema Skin: warm, dry Neuro: grossly normal, moves all extremities, PERRLA  Assessment/Plan:  53 y.o. male presenting for annual physical.  Health Maintenance counseling: 1. Anticipatory guidance: Patient counseled regarding regular dental exams -q6 months, eye exams -yearly,  avoiding smoking and second hand smoke , limiting alcohol to 2 beverages per day - rare social drinking .   2. Risk factor reduction:  Advised patient of need for regular exercise and diet rich and fruits and vegetables to reduce risk of heart attack and stroke. Exercise- still swimming, starting to do some weights-high rep low eight . Diet-patient is down almost 50 pounds from 2016- but 10 lbs over the last 3 months. Trying to cut down on late night snacking (mainly just eating at work and at the latest 6 PM). Long term goal 200  Technically morbid obesity but he is close to getting  BMI under 35 and then would no longer qualify. Right now BMI over 35 with HTN and HLD qualifying him for this. Counseling as above Wt Readings from Last 3 Encounters:  10/27/18 246 lb (111.6 kg)  08/17/18 256 lb (116.1 kg)  08/03/18 261 lb (118.4 kg)  3. Immunizations/screenings/ancillary studies- declines flu and shingrix shot  Immunization History  Administered Date(s) Administered  . Td 06/26/2010  4. Prostate cancer screening-   Low risk PSA trend. Has had some BPH on exam in past but with low PSA. Nocturia twice a night. We opted to get PSA only to trend unless PSA trend becomes  concerning Lab Results  Component Value Date   PSA 0.28 07/24/2016   PSA 0.34 07/11/2015   5. Colon cancer screening - 08/17/18 with 10-year repeat planned 6. Skin cancer screening- no dermatologist. advised regular sunscreen use. Denies worrisome, changing, or new skin lesions.  7.  Never smoker  Status of chronic or acute concerns   Hyperglycemia-noted in the past.  Has been better over the last 2 years after tremendous weight loss.  Essential hypertension hypertension- mild poorly controlled on initial check- on my repeat actually up to 104/90.  On no medication. He thinks #s have looked good at home but hasnt checked recently. We will do some home monitoring and he will let us know within 2 weeks by mychart. If above 140/90 average then consider low dose amlodipine 2.5mg   Hyperlipidemia Hyperlipidemia- mild elevation of lipids in the past.  We will update lipid panel and ten-year risk of heart attack and stroke-consider statin or coronary CT if risk above 10% on ASCVD risk. Less likely to start statin on him given his continued weight loss.   Erectile dysfunction Trial of erectile dysfunction medication was helpful- sildenafil  4-5 month weight and BP check . Likely sooner if start amlodipine   Lab/Order associations:FASTING  Preventative health care - Plan: CBC, Comprehensive metabolic panel, Lipid panel, PSA  Essential hypertension - Plan: CBC, Comprehensive metabolic panel, Lipid panel  Hyperlipidemia, unspecified hyperlipidemia type - Plan: CBC, Comprehensive metabolic panel, Lipid panel  Erectile dysfunction, unspecified erectile dysfunction type  Screening for prostate cancer - Plan: PSA  Meds ordered this encounter  Medications  . sildenafil (REVATIO) 20 MG tablet    Sig: Either sildenafil OR viagra- whichever cheaper. Take 2-5 tablets as needed once every 48 hours for erectile dysfunction    Dispense:  50 tablet    Refill:  5    Return precautions advised.    Tana Conch, MD

## 2018-10-27 NOTE — Assessment & Plan Note (Signed)
hypertension- mild poorly controlled on initial check- on my repeat actually up to 104/90.  On no medication. He thinks #s have looked good at home but hasnt checked recently. We will do some home monitoring and he will let us know within 2 weeks by mychart. If above 140/90 average then consider low dose amlodipine 2.5mg 

## 2018-10-27 NOTE — Patient Instructions (Addendum)
Please stop by lab before you go  [place] job on losing 10 lbs! Keep up the great effort. Lets check in- in person within 4-6 months with another goal of at least 3-5 lbs in that time frame (you might go past this goal and that's ok)   Your blood pressure trend concerns me. I would like for you to buy/use a home cuff to check at least 4x a week. Your goal is <140/90. Try to use a cuff on your upper arm. Send me a message in 2-3 weeks with a list of your home #s over the last few weeks and we will decide if we need to use a blood pressure medicine like amlodipine while you continue to lose weight (we could come off later if pressures look better)

## 2018-10-27 NOTE — Assessment & Plan Note (Signed)
Hyperlipidemia- mild elevation of lipids in the past.  We will update lipid panel and ten-year risk of heart attack and stroke-consider statin or coronary CT if risk above 10% on ASCVD risk. Less likely to start statin on him given his continued weight loss.

## 2019-05-11 ENCOUNTER — Ambulatory Visit: Payer: BLUE CROSS/BLUE SHIELD | Admitting: Family Medicine

## 2019-05-11 DIAGNOSIS — Z0289 Encounter for other administrative examinations: Secondary | ICD-10-CM

## 2019-05-18 ENCOUNTER — Other Ambulatory Visit: Payer: Self-pay

## 2019-05-18 ENCOUNTER — Ambulatory Visit (INDEPENDENT_AMBULATORY_CARE_PROVIDER_SITE_OTHER): Payer: BC Managed Care – PPO | Admitting: Family Medicine

## 2019-05-18 ENCOUNTER — Ambulatory Visit (INDEPENDENT_AMBULATORY_CARE_PROVIDER_SITE_OTHER): Payer: BC Managed Care – PPO

## 2019-05-18 ENCOUNTER — Encounter: Payer: Self-pay | Admitting: Family Medicine

## 2019-05-18 VITALS — BP 142/86 | HR 77 | Temp 97.9°F | Resp 16 | Ht 70.0 in | Wt 273.8 lb

## 2019-05-18 DIAGNOSIS — S93401A Sprain of unspecified ligament of right ankle, initial encounter: Secondary | ICD-10-CM | POA: Diagnosis not present

## 2019-05-18 NOTE — Patient Instructions (Signed)
Please follow up if symptoms do not improve or as needed.   I have ordered physical therapy to rehab your ankle. We will call you to get this set up.    Ankle Sprain  An ankle sprain is a stretch or tear in a ligament in the ankle. Ligaments are tissues that connect bones to each other. The two most common types of ankle sprains are:  Inversion sprain. This happens when the foot turns inward and the ankle rolls outward. It affects the ligament on the outside of the foot (lateral ligament).  Eversion sprain. This happens when the foot turns outward and the ankle rolls inward. It affects the ligament on the inner side of the foot (medial ligament). What are the causes? This condition is often caused by accidentally rolling or twisting the ankle. What increases the risk? You are more likely to develop this condition if you play sports. What are the signs or symptoms? Symptoms of this condition include:  Pain in your ankle.  Swelling.  Bruising. This may develop right after you sprain your ankle or 1-2 days later.  Trouble standing or walking, especially when you turn or change directions. How is this diagnosed? This condition is diagnosed with:  A physical exam. During the exam, your health care provider will press on certain parts of your foot and ankle and try to move them in certain ways.  X-ray imaging. These may be taken to see how severe the sprain is and to check for broken bones. How is this treated? This condition may be treated with:  A brace or splint. This is used to keep the ankle from moving until it heals.  An elastic bandage. This is used to support the ankle.  Crutches.  Pain medicine.  Surgery. This may be needed if the sprain is severe.  Physical therapy. This may help to improve the range of motion in the ankle. Follow these instructions at home: If you have a brace or a splint:  Wear the brace or splint as told by your health care provider. Remove  it only as told by your health care provider.  Loosen the brace or splint if your toes tingle, become numb, or turn cold and blue.  Keep the brace or splint clean.  If the brace or splint is not waterproof: ? Do not let it get wet. ? Cover it with a watertight covering when you take a bath or a shower. If you have an elastic bandage (dressing):  Remove it to shower or bathe.  Try not to move your ankle much, but wiggle your toes from time to time. This helps to prevent swelling.  Adjust the dressing to make it more comfortable if it feels too tight.  Loosen the dressing if you have numbness or tingling in your foot, or if your foot becomes cold and blue. Managing pain, stiffness, and swelling   Take over-the-counter and prescription medicines only as told by your health care provider.  For 2-3 days, keep your ankle raised (elevated) above the level of your heart as much as possible.  If directed, put ice on the injured area: ? If you have a removable brace or splint, remove it as told by your health care provider. ? Put ice in a plastic bag. ? Place a towel between your skin and the bag. ? Leave the ice on for 20 minutes, 2-3 times a day. General instructions  Rest your ankle.  Do not use the injured limb to support  your body weight until your health care provider says that you can. Use crutches as told by your health care provider.  Do not use any products that contain nicotine or tobacco, such as cigarettes, e-cigarettes, and chewing tobacco. If you need help quitting, ask your health care provider.  Keep all follow-up visits as told by your health care provider. This is important. Contact a health care provider if:  You have rapidly increasing bruising or swelling.  Your pain is not relieved with medicine. Get help right away if:  Your foot or toes become numb or blue.  You have severe pain that gets worse. Summary  An ankle sprain is a stretch or tear in a  ligament in the ankle. Ligaments are tissues that connect bones to each other.  This condition is often caused by accidentally rolling or twisting the ankle.  Symptoms include pain, swelling, bruising, and trouble walking.  To relieve pain and swelling, put ice on the affected ankle, raise your ankle above the level of your heart, and use an elastic bandage.  Keep all follow-up visits as told by your health care provider. This is important. This information is not intended to replace advice given to you by your health care provider. Make sure you discuss any questions you have with your health care provider. Document Released: 10/05/2005 Document Revised: 06/27/2018 Document Reviewed: 03/01/2018 Elsevier Patient Education  2020 [employer]

## 2019-05-18 NOTE — Progress Notes (Signed)
Please call patient: I have reviewed his/her lab results. Xray of ankle looks ok. No fractures. PT as discussed.

## 2019-05-18 NOTE — Progress Notes (Signed)
Subjective  CC:  Chief Complaint  Patient presents with  . Ankle Pain    Right side, 2 months ago. Almost fell through the ceiling.. Twisted ankle, has used ice and imobilized it.Marland Kitchen He reports he had been using Tylenol and IBU    HPI: Daniel Rosales is a 53 y.o. male who presents to the office today to address the problems listed above in the chief complaint.  53 year old male who was climbing a ladder in his attic when one foot fell through the roof and the right foot got wedged between 2 joist boards.  He had immediate moderate to severe lateral swelling and pain.  He continued to work through that day and was able to ambulate.  Over the course of the next 2 weeks swelling improved.  He did use an Ace wrap but never rested the ankle.  He never sought any further evaluation.  Now continues to have lateral ankle pain worse with prolonged walking.  No locking or give way.  No history of ankle problems.  No recent or prior ankle sprains.  He denies leg pain or foot pain. Assessment  1. Sprain of right ankle, unspecified ligament, initial encounter      Plan   Chronic sprain:  Ankle brace, xray, PT for rehab and education. nsaids as needed.   Follow up: Return if symptoms worsen or fail to improve.  Visit date not found  Orders Placed This Encounter  Procedures  . DG Ankle Complete Right  . Ambulatory referral to Physical Therapy   No orders of the defined types were placed in this encounter.     I reviewed the patients updated PMH, FH, and SocHx.    Patient Active Problem List   Diagnosis Date Noted  . Erectile dysfunction 06/29/2018  . Tendinopathy of right rotator cuff 09/20/2015  . Hyperglycemia 04/25/2012  . Hyperlipidemia 06/26/2010  . Morbid obesity ([place]) 06/26/2010  . Essential hypertension 06/26/2010   Current Meds  Medication Sig  . sildenafil (REVATIO) 20 MG tablet Either sildenafil OR viagra- whichever cheaper. Take 2-5 tablets as needed once every 48 hours  for erectile dysfunction  . UNABLE TO FIND Med Name: herbal supplement sea moss/berdock root/bladderact    Allergies: Patient has No Known Allergies. Family History: Patient family history includes Diabetes in his father; Diabetes type II in his brother, mother, and sister; Hypertension in his father and sister; Kidney disease in his sister. Social History:  Patient  reports that he has never smoked. He has never used smokeless tobacco. He reports current alcohol use. He reports that he does not use drugs.  Review of Systems: Constitutional: Negative for fever malaise or anorexia Cardiovascular: negative for chest pain Respiratory: negative for SOB or persistent cough Gastrointestinal: negative for abdominal pain  Objective  Vitals: BP (!) 142/86   Pulse 77   Temp 97.9 F (36.6 C) (Oral)   Resp 16   Ht 5\' 10"  (1.778 m)   Wt 273 lb 12.8 oz (124.2 kg)   SpO2 97%   BMI 39.29 kg/m  General: no acute distress , A&Ox3 No limp Skin:  Warm, no rashes Right ankle: lateral ligament ttp and pain with inversion, otherwise stable joint. No posterior ttp, no metatarsal ttp.     Commons side effects, risks, benefits, and alternatives for medications and treatment plan prescribed today were discussed, and the patient expressed understanding of the given instructions. Patient is instructed to call or message via MyChart if he/she has any questions or  concerns regarding our treatment plan. No barriers to understanding were identified. We discussed Red Flag symptoms and signs in detail. Patient expressed understanding regarding what to do in case of urgent or emergency type symptoms.   Medication list was reconciled, printed and provided to the patient in AVS. Patient instructions and summary information was reviewed with the patient as documented in the AVS. This note was prepared with assistance of Dragon voice recognition software. Occasional wrong-word or sound-a-like substitutions may have  occurred due to the inherent limitations of voice recognition software

## 2019-05-19 ENCOUNTER — Ambulatory Visit: Payer: Self-pay | Admitting: Family Medicine

## 2019-05-19 NOTE — Telephone Encounter (Signed)
Noted  

## 2019-05-19 NOTE — Telephone Encounter (Signed)
See note

## 2019-05-19 NOTE — Telephone Encounter (Signed)
Pt returned call and was given x-ray results from Dr. Billey Chang dated 05/18/2019 at 4:45 PM. No questions from pt.   He was glad it was not broken/fractured.    He verbalized understanding of the PT that was discussed during his visit.

## 2019-05-26 ENCOUNTER — Encounter: Payer: Self-pay | Admitting: Family Medicine

## 2019-06-01 ENCOUNTER — Ambulatory Visit: Payer: BC Managed Care – PPO | Admitting: Physical Therapy

## 2019-08-17 DIAGNOSIS — I10 Essential (primary) hypertension: Secondary | ICD-10-CM | POA: Diagnosis not present

## 2019-08-17 DIAGNOSIS — E785 Hyperlipidemia, unspecified: Secondary | ICD-10-CM | POA: Diagnosis not present

## 2019-08-17 DIAGNOSIS — Z1331 Encounter for screening for depression: Secondary | ICD-10-CM | POA: Diagnosis not present

## 2019-12-05 DIAGNOSIS — Z0000 Encounter for general adult medical examination without abnormal findings: Secondary | ICD-10-CM | POA: Diagnosis not present

## 2019-12-05 DIAGNOSIS — E7849 Other hyperlipidemia: Secondary | ICD-10-CM | POA: Diagnosis not present

## 2019-12-05 DIAGNOSIS — R82998 Other abnormal findings in urine: Secondary | ICD-10-CM | POA: Diagnosis not present

## 2019-12-05 DIAGNOSIS — I10 Essential (primary) hypertension: Secondary | ICD-10-CM | POA: Diagnosis not present

## 2019-12-06 DIAGNOSIS — Z1212 Encounter for screening for malignant neoplasm of rectum: Secondary | ICD-10-CM | POA: Diagnosis not present

## 2019-12-12 DIAGNOSIS — E785 Hyperlipidemia, unspecified: Secondary | ICD-10-CM | POA: Diagnosis not present

## 2019-12-12 DIAGNOSIS — Z0000 Encounter for general adult medical examination without abnormal findings: Secondary | ICD-10-CM | POA: Diagnosis not present

## 2019-12-12 DIAGNOSIS — I10 Essential (primary) hypertension: Secondary | ICD-10-CM | POA: Diagnosis not present

## 2019-12-12 DIAGNOSIS — Z125 Encounter for screening for malignant neoplasm of prostate: Secondary | ICD-10-CM | POA: Diagnosis not present

## 2019-12-19 IMAGING — DX RIGHT ANKLE - COMPLETE 3+ VIEW
3 series · 3 of 3 positions shown · non-contrast
Comparison: None

CLINICAL DATA: Ankle sprain RIGHT ankle 2 months ago, initial
encounter

EXAM:
RIGHT ANKLE - COMPLETE 3+ VIEW

[ankle ap]
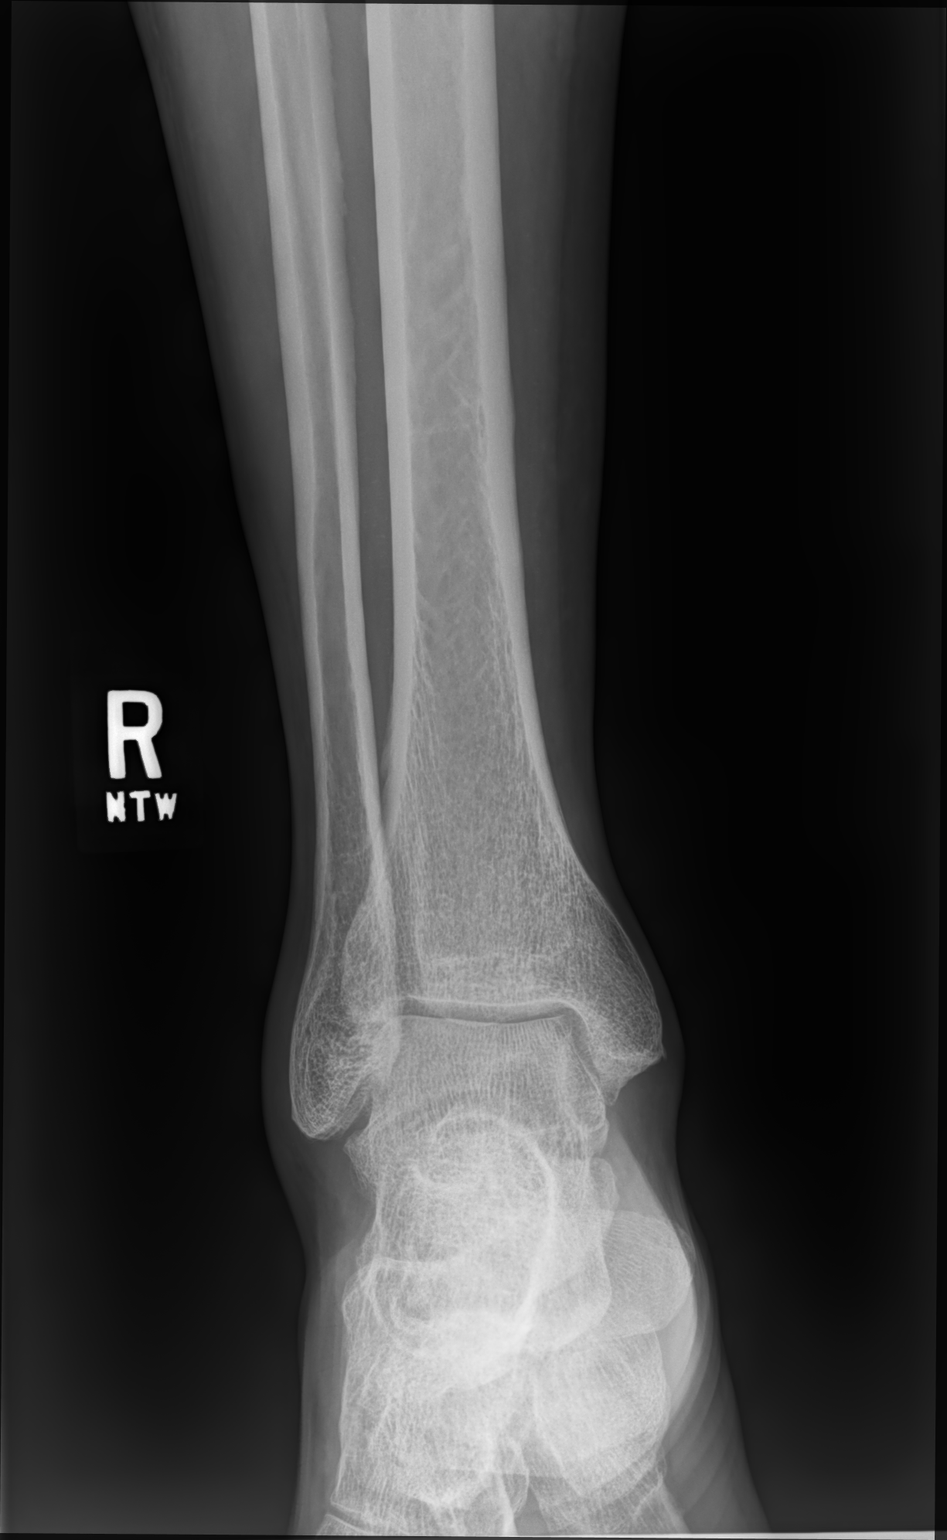

[ankle oblique]
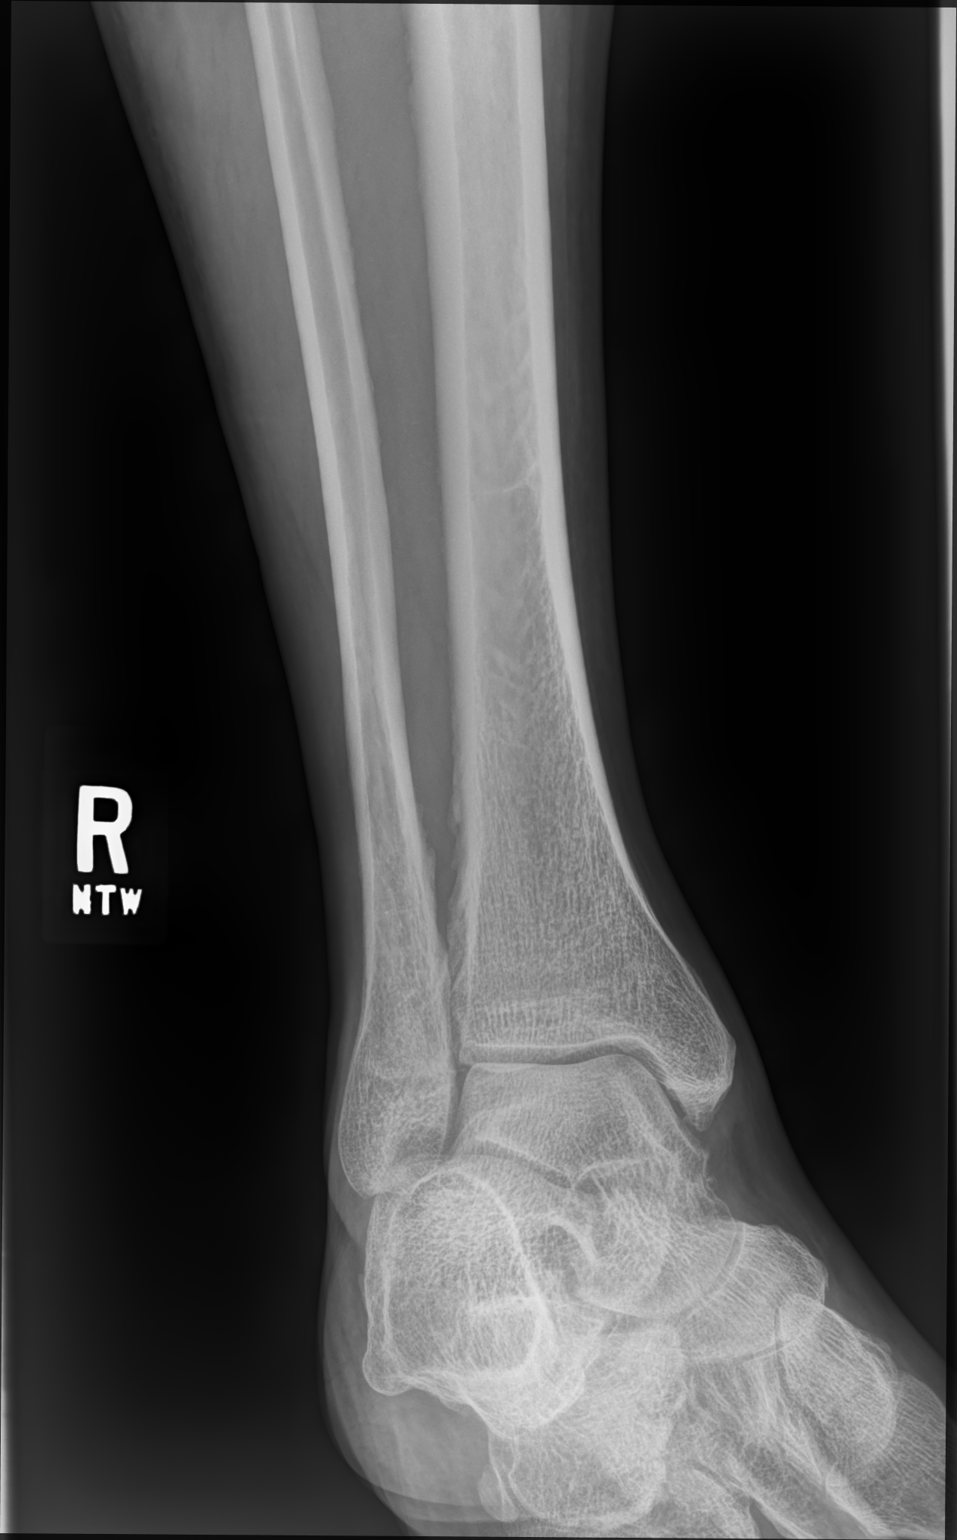

[ankle lat]
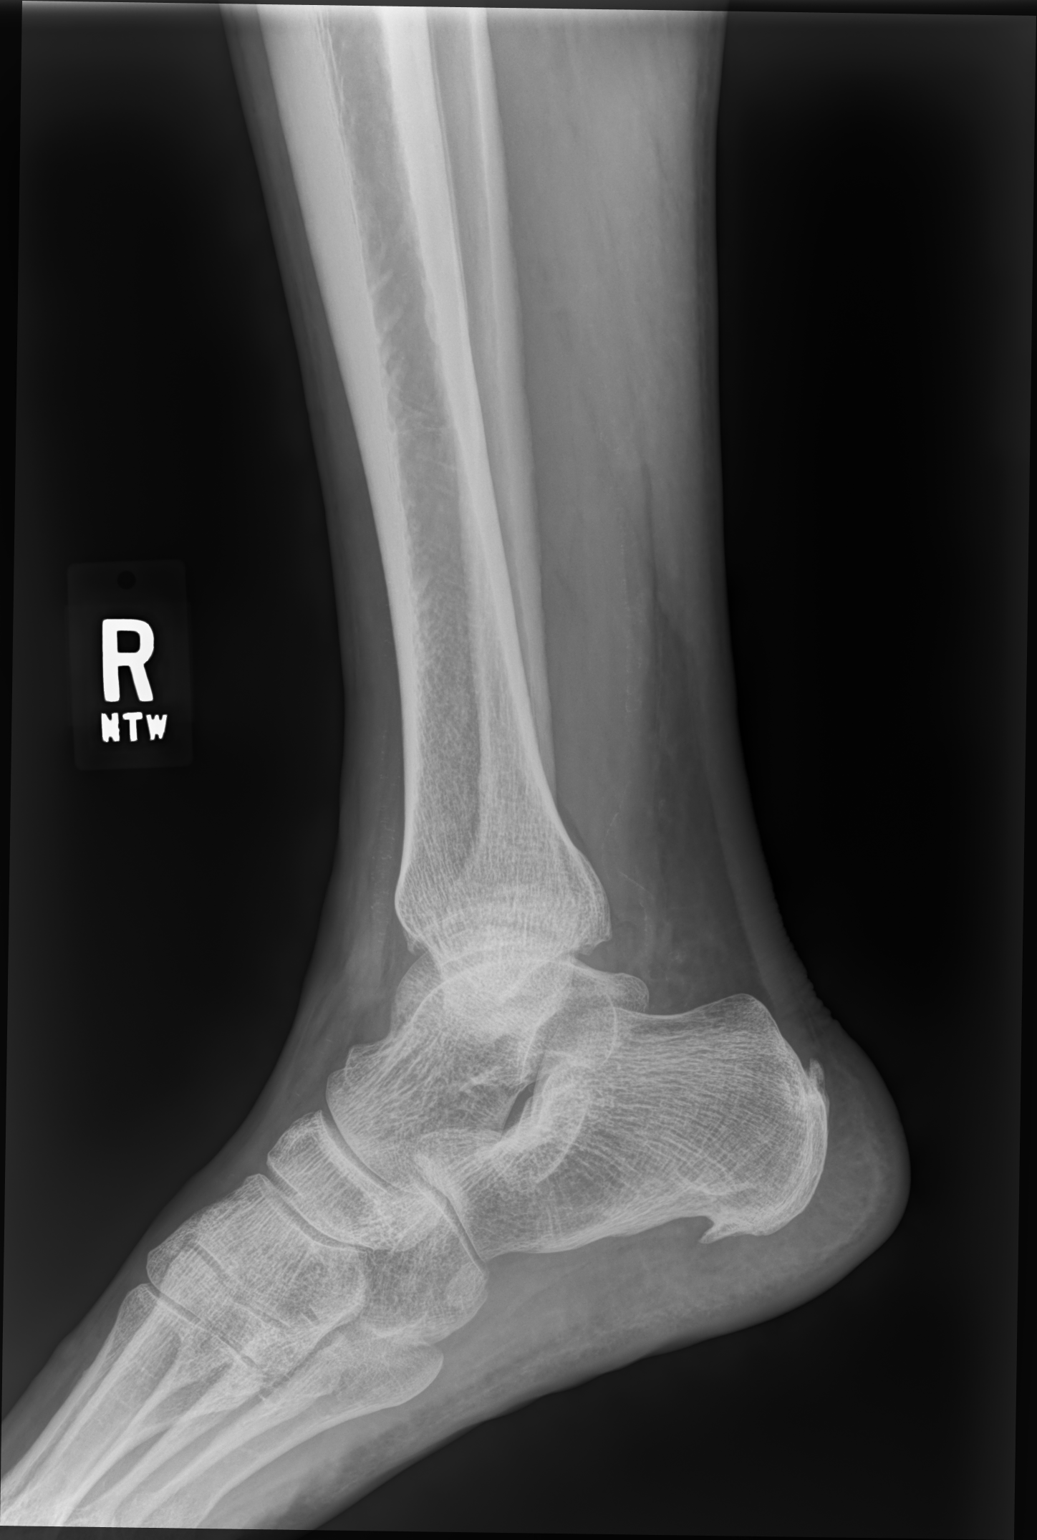

[3 of 3 positions shown; findings below may reference images not displayed]

FINDINGS: Osseous mineralization normal.

Joint spaces preserved.

Small plantar and Achilles insertion calcaneal spurs.

No acute fracture, dislocation, or bone destruction.

Scattered small vessel vascular calcifications at the lower leg and
ankle.
IMPRESSION: No acute osseous abnormalities.

## 2020-01-02 DIAGNOSIS — M7732 Calcaneal spur, left foot: Secondary | ICD-10-CM | POA: Diagnosis not present

## 2020-01-02 DIAGNOSIS — M722 Plantar fascial fibromatosis: Secondary | ICD-10-CM | POA: Diagnosis not present

## 2020-04-18 DIAGNOSIS — I10 Essential (primary) hypertension: Secondary | ICD-10-CM | POA: Diagnosis not present

## 2020-12-12 DIAGNOSIS — E785 Hyperlipidemia, unspecified: Secondary | ICD-10-CM | POA: Diagnosis not present

## 2020-12-12 DIAGNOSIS — Z125 Encounter for screening for malignant neoplasm of prostate: Secondary | ICD-10-CM | POA: Diagnosis not present

## 2020-12-18 DIAGNOSIS — Z0000 Encounter for general adult medical examination without abnormal findings: Secondary | ICD-10-CM | POA: Diagnosis not present

## 2020-12-18 DIAGNOSIS — R82998 Other abnormal findings in urine: Secondary | ICD-10-CM | POA: Diagnosis not present

## 2020-12-18 DIAGNOSIS — E7439 Other disorders of intestinal carbohydrate absorption: Secondary | ICD-10-CM | POA: Diagnosis not present

## 2020-12-18 DIAGNOSIS — I10 Essential (primary) hypertension: Secondary | ICD-10-CM | POA: Diagnosis not present

## 2020-12-18 DIAGNOSIS — Z1212 Encounter for screening for malignant neoplasm of rectum: Secondary | ICD-10-CM | POA: Diagnosis not present

## 2020-12-18 DIAGNOSIS — N529 Male erectile dysfunction, unspecified: Secondary | ICD-10-CM | POA: Diagnosis not present

## 2021-06-24 DIAGNOSIS — N529 Male erectile dysfunction, unspecified: Secondary | ICD-10-CM | POA: Diagnosis not present

## 2021-07-09 ENCOUNTER — Other Ambulatory Visit: Payer: Self-pay

## 2021-07-09 ENCOUNTER — Emergency Department (HOSPITAL_COMMUNITY)
Admission: EM | Admit: 2021-07-09 | Discharge: 2021-07-10 | Disposition: A | Discharge status: Home / Self Care | Payer: BC Managed Care – PPO | Attending: Emergency Medicine | Admitting: Emergency Medicine

## 2021-07-09 ENCOUNTER — Encounter (HOSPITAL_COMMUNITY): Payer: Self-pay

## 2021-07-09 DIAGNOSIS — I10 Essential (primary) hypertension: Secondary | ICD-10-CM | POA: Insufficient documentation

## 2021-07-09 DIAGNOSIS — M25561 Pain in right knee: Secondary | ICD-10-CM | POA: Insufficient documentation

## 2021-07-09 DIAGNOSIS — Z79899 Other long term (current) drug therapy: Secondary | ICD-10-CM | POA: Diagnosis not present

## 2021-07-09 LAB — CBC WITH DIFFERENTIAL/PLATELET
Abs Immature Granulocytes: 0.01 10*3/uL (ref 0.00–0.07)
Basophils Absolute: 0 10*3/uL (ref 0.0–0.1)
Basophils Relative: 0 %
Eosinophils Absolute: 0.2 10*3/uL (ref 0.0–0.5)
Eosinophils Relative: 2 %
HCT: 39.1 % (ref 39.0–52.0)
Hemoglobin: 13.2 g/dL (ref 13.0–17.0)
Immature Granulocytes: 0 %
Lymphocytes Relative: 23 %
Lymphs Abs: 2.3 10*3/uL (ref 0.7–4.0)
MCH: 29.9 pg (ref 26.0–34.0)
MCHC: 33.8 g/dL (ref 30.0–36.0)
MCV: 88.7 fL (ref 80.0–100.0)
Monocytes Absolute: 0.6 10*3/uL (ref 0.1–1.0)
Monocytes Relative: 7 %
Neutro Abs: 6.6 10*3/uL (ref 1.7–7.7)
Neutrophils Relative %: 68 %
Platelets: 185 10*3/uL (ref 150–400)
RBC: 4.41 MIL/uL (ref 4.22–5.81)
RDW: 12.3 % (ref 11.5–15.5)
WBC: 9.7 10*3/uL (ref 4.0–10.5)
nRBC: 0 % (ref 0.0–0.2)

## 2021-07-09 LAB — COMPREHENSIVE METABOLIC PANEL
ALT: 29 U/L (ref 0–44)
AST: 20 U/L (ref 15–41)
Albumin: 4.2 g/dL (ref 3.5–5.0)
Alkaline Phosphatase: 85 U/L (ref 38–126)
Anion gap: 8 (ref 5–15)
BUN: 10 mg/dL (ref 6–20)
CO2: 27 mmol/L (ref 22–32)
Calcium: 8.9 mg/dL (ref 8.9–10.3)
Chloride: 102 mmol/L (ref 98–111)
Creatinine, Ser: 0.75 mg/dL (ref 0.61–1.24)
GFR, Estimated: >60 mL/min (ref >60)
Glucose, Bld: 137 mg/dL — ABNORMAL HIGH (ref 70–99)
Potassium: 3.4 mmol/L — ABNORMAL LOW (ref 3.5–5.1)
Sodium: 137 mmol/L (ref 135–145)
Total Bilirubin: 1 mg/dL (ref 0.3–1.2)
Total Protein: 7.3 g/dL (ref 6.5–8.1)

## 2021-07-09 LAB — D-DIMER, QUANTITATIVE: D-Dimer, Quant: 0.3 ug/mL-FEU (ref 0.00–0.50)

## 2021-07-09 NOTE — ED Triage Notes (Signed)
Pt c/o right leg swelling that started today. Pt states its painful. Pt ambulatory.

## 2021-07-10 ENCOUNTER — Emergency Department (HOSPITAL_COMMUNITY): Payer: BC Managed Care – PPO

## 2021-07-10 DIAGNOSIS — G8911 Acute pain due to trauma: Secondary | ICD-10-CM | POA: Diagnosis not present

## 2021-07-10 DIAGNOSIS — M25561 Pain in right knee: Secondary | ICD-10-CM | POA: Diagnosis not present

## 2021-07-10 MED ORDER — NAPROXEN 500 MG PO TABS
500.0000 mg | ORAL_TABLET | Freq: Once | ORAL | Status: AC
  Administered 2021-07-10: 500 mg via ORAL
  Filled 2021-07-10: qty 1

## 2021-07-10 MED ORDER — NAPROXEN 500 MG PO TABS: 500.0000 mg | ORAL_TABLET | Freq: Two times a day (BID) | ORAL | 0 refills | Status: DC

## 2021-07-10 NOTE — ED Provider Notes (Signed)
[facility]-EMERGENCY DEPT Provider Note   CSN: [id number] Arrival date & time: 07/09/21  2243     History Chief Complaint  Patient presents with   Right Leg Swelling    Daniel Rosales is a 55 y.o. male.  55 year old male presents to the emergency department for evaluation of pain to his right knee.  He states that pain began after he finished work today.  He stands for long hours at his job.  Has a history of prior right knee injury in adolescence, but no other issues since.  Denies any recent falls, trauma.  He has had some associated swelling of the right knee.  Pain in the knee makes flexion difficult.  Has some discomfort with weightbearing, but remains ambulatory.  Denies fevers, IV drug use, history of gout, concern for sexually transmitted infections, history of diabetes.  No associated numbness or paresthesias.  The history is provided by the patient. No language interpreter was used.      Past Medical History:  Diagnosis Date   Hyperlipidemia    Hypertension     Patient Active Problem List   Diagnosis Date Noted   Erectile dysfunction 06/29/2018   Tendinopathy of right rotator cuff 09/20/2015   Hyperglycemia 04/25/2012   Hyperlipidemia 06/26/2010   Morbid obesity (HCC) 06/26/2010   Essential hypertension 06/26/2010    Past Surgical History:  Procedure Laterality Date   CIRCUMCISION  at age 46       Family History  Problem Relation Age of Onset   Diabetes type II Mother    Diabetes Father        amputuation   Hypertension Father    Kidney disease Sister        renal failure. unclear cause   Hypertension Sister    Diabetes type II Sister    Diabetes type II Brother    Colon cancer Neg Hx    Esophageal cancer Neg Hx    Stomach cancer Neg Hx    Rectal cancer Neg Hx    Colon polyps Neg Hx     Social History   Tobacco Use   Smoking status: Never   Smokeless tobacco: Never  Vaping Use   Vaping Use: Never used  Substance Use  Topics   Alcohol use: Yes    Comment: occ.    Drug use: No    Home Medications Prior to Admission medications   Medication Sig Start Date End Date Taking? Authorizing Provider  naproxen (NAPROSYN) 500 MG tablet Take 1 tablet (500 mg total) by mouth 2 (two) times daily with a meal. 07/10/21  Yes Antony Madura, PA-C  losartan (COZAAR) 50 MG tablet Take 50 mg by mouth at bedtime. 01/21/21   [provider]  sildenafil (REVATIO) 20 MG tablet Either sildenafil OR viagra- whichever cheaper. Take 2-5 tablets as needed once every 48 hours for erectile dysfunction 10/27/18   Shelva Majestic, MD  UNABLE TO FIND Med Name: herbal supplement sea moss/berdock root/bladderact    [provider]    Allergies    Patient has no known allergies.  Review of Systems   Review of Systems Ten systems reviewed and are negative for acute change, except as noted in the HPI.    Physical Exam Updated Vital Signs BP (!) 155/101   Pulse (!) 59   Temp 97.8 F (36.6 C) (Oral)   Resp 16   Ht 5\' 11"  (1.803 m)   Wt 127 kg   SpO2 99%  BMI 39.05 kg/m   Physical Exam Vitals and nursing note reviewed.  Constitutional:      General: He is not in acute distress.    Appearance: He is well-developed. He is not diaphoretic.     Comments: Nontoxic-appearing and in no distress  HENT:     Head: Normocephalic and atraumatic.  Eyes:     General: No scleral icterus.    Conjunctiva/sclera: Conjunctivae normal.  Cardiovascular:     Rate and Rhythm: Normal rate and regular rhythm.     Pulses: Normal pulses.     Comments: DP pulse in the right lower extremity Pulmonary:     Effort: Pulmonary effort is normal. No respiratory distress.     Comments: Respirations even and unlabored Musculoskeletal:        General: Normal range of motion.     Cervical back: Normal range of motion.     Comments: Slight limitation of right knee flexion secondary to pain.  There is a palpable right knee effusion with mild  warmth over the joint.  No associated erythema, lymphangitic streaking.  No bony deformity, crepitus.  Skin:    General: Skin is warm and dry.     Coloration: Skin is not pale.     Findings: No erythema or rash.  Neurological:     Mental Status: He is alert and oriented to person, place, and time.     Coordination: Coordination normal.  Psychiatric:        Behavior: Behavior normal.    ED Results / Procedures / Treatments   Labs (all labs ordered are listed, but only abnormal results are displayed) Labs Reviewed  COMPREHENSIVE METABOLIC PANEL - Abnormal; Notable for the following components:      Result Value   Potassium 3.4 (*)    Glucose, Bld 137 (*)    All other components within normal limits  D-DIMER, QUANTITATIVE  CBC WITH DIFFERENTIAL/PLATELET    EKG None  Radiology DG Knee Complete 4 Views Right  Result Date: 07/10/2021 CLINICAL DATA:  Knee pain. EXAM: RIGHT KNEE - COMPLETE 4+ VIEW COMPARISON:  None. FINDINGS: No acute fracture or dislocation. Joint spaces are well preserved. Sharpening of the tibial spines. Soft tissues are unremarkable. IMPRESSION: 1. No acute findings. 2. Mild osteoarthritis. Electronically Signed   By: Signa Kell M.D.   On: 07/10/2021 05:48    Procedures Procedures   Medications Ordered in ED Medications  naproxen (NAPROSYN) tablet 500 mg (has no administration in time range)    ED Course  I have reviewed the triage vital signs and the nursing notes.  Pertinent labs & imaging results that were available during my care of the patient were reviewed by me and considered in my medical decision making (see chart for details).    MDM Rules/Calculators/A&P                           Patient presents to the emergency department for evaluation of atraumatic R knee pain. Works for long hours on his feet when at his job. Patient neurovascularly intact on exam. Imaging negative for fracture, dislocation, bony deformity. Does show osteoarthritic  changes. No erythema, fever, leukocytosis, red flags; presently low suspicion for septic joint. Compartments in the affected extremity are soft. Suspect osteoarthritis flare related to overuse.   Plan for supportive management including RICE and NSAIDs; primary care follow up as needed. Return precautions discussed and provided. Patient discharged in stable condition with no unaddressed concerns.  Final Clinical Impression(s) / ED Diagnoses Final diagnoses:  Acute pain of right knee    Rx / DC Orders ED Discharge Orders          Ordered    naproxen (NAPROSYN) 500 MG tablet  2 times daily with meals        07/10/21 0555             Antony Madura, PA-C 07/10/21 0557    Glynn Octave, MD 07/10/21 [phone]

## 2021-07-10 NOTE — Discharge Instructions (Addendum)
Apply ice to your knee 3-4 times per day to limit inflammation.  Avoid strenuous activity and wear a knee brace for stability.  We recommend consistent use of naproxen for management of pain and inflammation.  Follow-up with a primary care doctor to ensure resolution of symptoms.  Return to the ED for any new or concerning symptoms.

## 2021-07-15 ENCOUNTER — Encounter: Payer: Self-pay | Admitting: Surgery

## 2021-07-15 ENCOUNTER — Telehealth: Payer: Self-pay

## 2021-07-15 ENCOUNTER — Ambulatory Visit (INDEPENDENT_AMBULATORY_CARE_PROVIDER_SITE_OTHER): Payer: BC Managed Care – PPO | Admitting: Surgery

## 2021-07-15 VITALS — BP 165/107 | HR 82 | Ht 71.0 in | Wt 280.0 lb

## 2021-07-15 DIAGNOSIS — M25561 Pain in right knee: Secondary | ICD-10-CM

## 2021-07-15 DIAGNOSIS — M659 Synovitis and tenosynovitis, unspecified: Secondary | ICD-10-CM

## 2021-07-15 MED ORDER — METHYLPREDNISOLONE ACETATE 40 MG/ML IJ SUSP
80.0000 mg | INTRAMUSCULAR | Status: AC | PRN
  Administered 2021-07-15: 80 mg via INTRA_ARTICULAR

## 2021-07-15 MED ORDER — BUPIVACAINE HCL 0.25 % IJ SOLN
6.0000 mL | INTRAMUSCULAR | Status: AC | PRN
  Administered 2021-07-15: 6 mL via INTRA_ARTICULAR

## 2021-07-15 NOTE — Progress Notes (Signed)
Office Visit Note   Patient: Daniel Rosales           Date of Birth: 13-Mar-1966           MRN: [id number] Visit Date: 07/15/2021              Requested by: [facility] [address] [place],  [place] [address] PCP: [facility]   Assessment & Plan: Visit Diagnoses:  1. Acute pain of right knee   2. Synovitis     Plan: The patient's acute right knee pain that has failed conservative treatment with naproxen and bracing at this point I recommended trying intra-articular Marcaine/Depo-Medrol injection.  After patient sent right knee was prepped with Betadine and intra-articular Marcaine/Depo-Medrol 6-2 injection performed from an anterolateral approach.  Tolerated procedure well without complication.  I advised patient that I recommend that he discontinue his brace.  This is causing a fair amount of edema right lower extremity.  I discussed the increased risk of getting a DVT if he continues to use this.  His calf is nontender on exam today.  He voices understanding.  Use ice to the right knee off and on as needed.  Follow-up with me in 4 weeks for recheck.  If he still continues to have knee pain a week before his next appointment he will call let me know and I will plan to get an MRI to rule out lateral meniscal tear.  Follow-Up Instructions: Return in about 4 weeks (around 08/12/2021) for With [facility] recheck right knee.   Orders:  Orders Placed This Encounter  Procedures   Large Joint Inj   No orders of the defined types were placed in this encounter.     Procedures: Large Joint Inj: R knee on 07/15/2021 4:24 PM Indications: pain and joint swelling Details: 25 G 1.5 in needle, anterolateral approach Medications: 6 mL bupivacaine 0.25 %; 80 mg methylPREDNISolone acetate 40 MG/ML Outcome: tolerated well, no immediate complications Consent was given by the patient. Patient was prepped and draped in the usual sterile fashion.      Clinical  Data: No additional findings.   Subjective: Chief Complaint  Patient presents with   Right Knee - Pain    HPI 55 year old black male who is new patient to clinic comes in today with complaints of acute right knee pain.  Patient states that his right knee was doing very well up until July 09, 2021 when he had sudden onset of pain.  No injury.  Localized pain to the anterolateral joint line.  He is also has some swelling.  He has been wearing a knee brace for support.  Denies any complaints of calf pain.  He did go to the [facility] emergency room July 09, 2021 and had x-rays.  This showed mild DJD of the knee.  No acute finding.  He was prescribed naproxen. Review of Systems No current cardiac pulmonary GI GU issues  Objective: Vital Signs: BP (!) 165/107   Pulse 82   Ht 5\' 11"  (1.803 m)   Wt 280 lb (127 kg)   BMI 39.05 kg/m   Physical Exam Constitutional:      Appearance: He is obese.  HENT:     Head: Normocephalic.     Nose: Nose normal.  Eyes:     Extraocular Movements: Extraocular movements intact.  Pulmonary:     Effort: No respiratory distress.  Musculoskeletal:     Comments: Gait is somewhat antalgic.  Right knee  has good range of motion.  Some knee swelling without significant effusion.  Moderate to marked lateral joint line tenderness.  Negative McMurray's test.  After he remove the brace he does have 2-3+ right pretibial edema.  Calf is nontender.  No pitting edema on the left lower extremity.  Neurological:     General: No focal deficit present.     Mental Status: He is oriented to person, place, and time.  Psychiatric:        Mood and Affect: Mood normal.    Ortho Exam  Specialty Comments:  No specialty comments available.  Imaging: No results found.   PMFS History: Patient Active Problem List   Diagnosis Date Noted   Erectile dysfunction 06/29/2018   Tendinopathy of right rotator cuff 09/20/2015   Hyperglycemia 04/25/2012    Hyperlipidemia 06/26/2010   Morbid obesity (HCC) 06/26/2010   Essential hypertension 06/26/2010   Past Medical History:  Diagnosis Date   Hyperlipidemia    Hypertension     Family History  Problem Relation Age of Onset   Diabetes type II Mother    Diabetes Father        amputuation   Hypertension Father    Kidney disease Sister        renal failure. unclear cause   Hypertension Sister    Diabetes type II Sister    Diabetes type II Brother    Colon cancer Neg Hx    Esophageal cancer Neg Hx    Stomach cancer Neg Hx    Rectal cancer Neg Hx    Colon polyps Neg Hx     Past Surgical History:  Procedure Laterality Date   CIRCUMCISION  at age 34   Social History   Occupational History   Not on file  Tobacco Use   Smoking status: Never   Smokeless tobacco: Never  Vaping Use   Vaping Use: Never used  Substance and Sexual Activity   Alcohol use: Yes    Comment: occ.    Drug use: No   Sexual activity: Not on file

## 2021-07-15 NOTE — Telephone Encounter (Signed)
Attempted to contact patient however had to leave a voicemail. Patient is scheduled to see Fayrene Fearing at 4:15Pm on 9/27 however this will need to be changed to a earlier appointment in the afternoon. Patient was provided office number

## 2021-08-13 ENCOUNTER — Ambulatory Visit: Payer: BC Managed Care – PPO | Admitting: Surgery

## 2021-12-24 DIAGNOSIS — R739 Hyperglycemia, unspecified: Secondary | ICD-10-CM | POA: Diagnosis not present

## 2021-12-24 DIAGNOSIS — Z125 Encounter for screening for malignant neoplasm of prostate: Secondary | ICD-10-CM | POA: Diagnosis not present

## 2021-12-24 DIAGNOSIS — E785 Hyperlipidemia, unspecified: Secondary | ICD-10-CM | POA: Diagnosis not present

## 2021-12-31 DIAGNOSIS — Z1331 Encounter for screening for depression: Secondary | ICD-10-CM | POA: Diagnosis not present

## 2021-12-31 DIAGNOSIS — R82998 Other abnormal findings in urine: Secondary | ICD-10-CM | POA: Diagnosis not present

## 2021-12-31 DIAGNOSIS — E7439 Other disorders of intestinal carbohydrate absorption: Secondary | ICD-10-CM | POA: Diagnosis not present

## 2021-12-31 DIAGNOSIS — Z0000 Encounter for general adult medical examination without abnormal findings: Secondary | ICD-10-CM | POA: Diagnosis not present

## 2021-12-31 DIAGNOSIS — Z1339 Encounter for screening examination for other mental health and behavioral disorders: Secondary | ICD-10-CM | POA: Diagnosis not present

## 2022-03-30 DIAGNOSIS — Z1331 Encounter for screening for depression: Secondary | ICD-10-CM | POA: Diagnosis not present

## 2022-03-30 DIAGNOSIS — E7439 Other disorders of intestinal carbohydrate absorption: Secondary | ICD-10-CM | POA: Diagnosis not present

## 2023-01-29 DIAGNOSIS — N529 Male erectile dysfunction, unspecified: Secondary | ICD-10-CM | POA: Diagnosis not present

## 2023-01-29 DIAGNOSIS — R739 Hyperglycemia, unspecified: Secondary | ICD-10-CM | POA: Diagnosis not present

## 2023-01-29 DIAGNOSIS — Z125 Encounter for screening for malignant neoplasm of prostate: Secondary | ICD-10-CM | POA: Diagnosis not present

## 2023-01-29 DIAGNOSIS — E785 Hyperlipidemia, unspecified: Secondary | ICD-10-CM | POA: Diagnosis not present

## 2023-01-29 DIAGNOSIS — I10 Essential (primary) hypertension: Secondary | ICD-10-CM | POA: Diagnosis not present

## 2023-02-05 DIAGNOSIS — N529 Male erectile dysfunction, unspecified: Secondary | ICD-10-CM | POA: Diagnosis not present

## 2023-02-05 DIAGNOSIS — Z0000 Encounter for general adult medical examination without abnormal findings: Secondary | ICD-10-CM | POA: Diagnosis not present

## 2023-02-05 DIAGNOSIS — E7439 Other disorders of intestinal carbohydrate absorption: Secondary | ICD-10-CM | POA: Diagnosis not present

## 2023-02-05 DIAGNOSIS — Z1331 Encounter for screening for depression: Secondary | ICD-10-CM | POA: Diagnosis not present

## 2023-02-05 DIAGNOSIS — I10 Essential (primary) hypertension: Secondary | ICD-10-CM | POA: Diagnosis not present

## 2023-02-05 DIAGNOSIS — Z1339 Encounter for screening examination for other mental health and behavioral disorders: Secondary | ICD-10-CM | POA: Diagnosis not present

## 2023-05-05 ENCOUNTER — Encounter (INDEPENDENT_AMBULATORY_CARE_PROVIDER_SITE_OTHER): Payer: BC Managed Care – PPO | Admitting: Internal Medicine

## 2023-05-05 DIAGNOSIS — Z0289 Encounter for other administrative examinations: Secondary | ICD-10-CM

## 2023-06-01 ENCOUNTER — Ambulatory Visit (INDEPENDENT_AMBULATORY_CARE_PROVIDER_SITE_OTHER): Payer: BC Managed Care – PPO | Admitting: Internal Medicine

## 2023-06-01 ENCOUNTER — Encounter (INDEPENDENT_AMBULATORY_CARE_PROVIDER_SITE_OTHER): Payer: Self-pay | Admitting: Internal Medicine

## 2023-06-01 VITALS — BP 156/98 | HR 68 | Temp 98.2°F | Ht 70.0 in | Wt 297.0 lb

## 2023-06-01 DIAGNOSIS — E7800 Pure hypercholesterolemia, unspecified: Secondary | ICD-10-CM

## 2023-06-01 DIAGNOSIS — R948 Abnormal results of function studies of other organs and systems: Secondary | ICD-10-CM | POA: Insufficient documentation

## 2023-06-01 DIAGNOSIS — F32A Depression, unspecified: Secondary | ICD-10-CM

## 2023-06-01 DIAGNOSIS — Z6841 Body Mass Index (BMI) 40.0 and over, adult: Secondary | ICD-10-CM

## 2023-06-01 DIAGNOSIS — E66813 Obesity, class 3: Secondary | ICD-10-CM

## 2023-06-01 DIAGNOSIS — R5383 Other fatigue: Secondary | ICD-10-CM | POA: Diagnosis not present

## 2023-06-01 DIAGNOSIS — R7309 Other abnormal glucose: Secondary | ICD-10-CM | POA: Insufficient documentation

## 2023-06-01 DIAGNOSIS — R0602 Shortness of breath: Secondary | ICD-10-CM | POA: Diagnosis not present

## 2023-06-01 DIAGNOSIS — Z1331 Encounter for screening for depression: Secondary | ICD-10-CM

## 2023-06-01 DIAGNOSIS — I10 Essential (primary) hypertension: Secondary | ICD-10-CM

## 2023-06-01 NOTE — Progress Notes (Unsigned)
Chief Complaint:   OBESITY Daniel Rosales (MR# [id number]) is a 57 y.o. male who presents for evaluation and treatment of obesity and related comorbidities. Current BMI is Body mass index is 42.62 kg/m. Daniel Rosales has been struggling with his weight for many years and has been unsuccessful in either losing weight, maintaining weight loss, or reaching his healthy weight goal.  July is currently in the action stage of change and ready to dedicate time achieving and maintaining a healthier weight. [name] is interested in becoming our patient and working on intensive lifestyle modifications including (but not limited to) diet and exercise for weight loss.  Daniel Rosales's habits were reviewed today and are as follows: His family eats meals together, he thinks his family will eat healthier with him, his desired weight loss is 97 lbs, he has been heavy most of his life, he started gaining weight at 57 years old, his heaviest weight ever was 301 pounds, he has significant food cravings issues, he snacks frequently in the evenings, he wakes up frequently in the middle of the night to eat, he skips meals frequently, he is frequently drinking liquids with calories, he frequently makes poor food choices, he frequently eats larger portions than normal, and he struggles with emotional eating.  Depression Screen Daniel Rosales's Food and Mood (modified PHQ-9) score was 13.  Subjective:   1. Other fatigue Colbert admits to daytime somnolence and admits to waking up still tired. Patient has a history of symptoms of daytime fatigue and morning fatigue. Bartlett generally gets 5 hours of sleep per night, and states that he has nightime awakenings. Snoring is present. Apneic episodes are present. Epworth Sleepiness Score is 15.   2. SOB (shortness of breath) on exertion Daniel Rosales notes increasing shortness of breath with exercising and seems to be worsening over time with weight gain. He notes getting out of breath sooner with activity  than he used to. This has not gotten worse recently. Daniel Rosales denies shortness of breath at rest or orthopnea.  3. Pure hypercholesterolemia LDL is not at goal. Elevated LDL may be secondary to nutrition, genetics and spillover effect from excess adiposity. Recommended LDL goal is <70 to reduce the risk of fatty streaks and the progression to obstructive ASCVD in the future.  Reviewed labs using patient portal for [facility] his total cholesterol was 226 with an LDL of 166 this was from April.  His triglycerides were 83. He is not on statin therapy.  We will calculate cardiovascular risk at the next office visit for restratification.  Lab Results  Component Value Date   CHOL 219 (H) 10/27/2018   HDL 47.30 10/27/2018   LDLCALC 157 (H) 10/27/2018   LDLDIRECT 155.3 10/26/2012   TRIG 73.0 10/27/2018   CHOLHDL 5 10/27/2018   4. Abnormal glucose I reviewed labs via patient portal for [facility].  He had blood glucose of 130 he states this was done fasting.  His hemoglobin A1c had been 5.4.  5. Essential hypertension His blood pressure is uncontrolled.  I counseled him on the risk associated with elevated blood pressure.   6. Abnormal metabolism Patient has a slower than predicted metabolism. IC 2045 vs. calculated 2496.  Likely secondary to chronic skipping of meals and symptoms suspicious for obstructive sleep apnea.  This may contribute to weight gain, chronic fatigue and difficulty losing weight.   Assessment/Plan:   1. Other fatigue Daniel Rosales does feel that his weight is causing his energy to be lower than  it should be. Fatigue may be related to obesity, depression or many other causes. Labs will be ordered, and in the meanwhile, Ravion will focus on self care including making healthy food choices, increasing physical activity and focusing on stress reduction.  - EKG 12-Lead - Vitamin B12  2. SOB (shortness of breath) on exertion Daniel Rosales does feel that he gets  out of breath more easily that he used to when he exercises. Daniel Rosales's shortness of breath appears to be obesity related and exercise induced. He has agreed to work on weight loss and gradually increase exercise to treat his exercise induced shortness of breath. Will continue to monitor closely.  3. Pure hypercholesterolemia Continue weight loss therapy, losing 10% or more of body weight may improve condition. Also advised to reduce saturated fats in diet to less than 10% of daily calories.   4. Abnormal glucose We are checking a fasting blood glucose today and insulin levels.  - Comprehensive metabolic panel - Insulin, random  5. Essential hypertension I recommend that he check his blood pressure in the morning and also before bedtime over the next 3 days and to contact his PCP if blood pressure is above 140/90.  We discussed the benefits of lowering blood pressure to less than 130/80.  Losing 10% of body weight may improve blood pressure control.  6. Abnormal metabolism We reviewed measures to improve metabolism including not skipping meals, progressive strengthening exercises, increasing protein intake at every meal and maintaining adequate hydration and sleep.   7. Depression screen Daniel Rosales had a positive depression screening. Depression is commonly associated with obesity and often results in emotional eating behaviors. We will monitor this closely and work on CBT to help improve the non-hunger eating patterns. Referral to Psychology may be required if no improvement is seen as he continues in our clinic.  8. Class 3 severe obesity with serious comorbidity and body mass index (BMI) of 40.0 to 44.9 in adult, unspecified obesity type (HCC) We will check labs today.   - VITAMIN D 25 Hydroxy (Vit-D Deficiency, Fractures)  Daniel Rosales is currently in the action stage of change and his goal is to continue with weight loss efforts. I recommend Daniel Rosales begin the structured treatment plan as follows:  He  has agreed to the Category 3 Plan.  Exercise goals: All adults should avoid inactivity. Some physical activity is better than none, and adults who participate in any amount of physical activity gain some health benefits. 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   Behavioral modification strategies: increasing lean protein intake, decreasing simple carbohydrates, increasing vegetables, increasing water intake, decreasing liquid calories, increasing high fiber foods, no skipping meals, meal planning and cooking strategies, better snacking choices, avoiding temptations, and planning for success.  He was informed of the importance of frequent follow-up visits to maximize his success with intensive lifestyle modifications for his multiple health conditions. He was informed we would discuss his lab results at his next visit unless there is a critical issue that needs to be addressed sooner. Curlie agreed to keep his next visit at the agreed upon time to discuss these results.  Objective:   Blood pressure (!) 156/98, pulse 68, temperature 98.2 F (36.8 C), height 5\' 10"  (1.778 m), weight 297 lb (134.7 kg), SpO2 99%. Body mass index is 42.62 kg/m.  EKG: Normal sinus rhythm, rate 67 BPM.  Indirect Calorimeter completed today shows a VO2 of 296 and a REE of 2045.  His calculated basal metabolic rate is 1610  thus his basal metabolic rate is worse than expected.  General: Cooperative, alert, well developed, in no acute distress. HEENT: Conjunctivae and lids unremarkable. Cardiovascular: Regular rhythm.  Lungs: Normal work of breathing. Neurologic: No focal deficits.   Lab Results  Component Value Date   CREATININE 0.72 (L) 06/01/2023   BUN 10 06/01/2023   NA 141 06/01/2023   K 4.2 06/01/2023   CL 103 06/01/2023   CO2 26 06/01/2023   Lab Results  Component Value Date   ALT 25 06/01/2023   AST 17 06/01/2023   ALKPHOS 103 06/01/2023   BILITOT 0.5 06/01/2023   Lab Results  Component Value  Date   HGBA1C 5.1 10/26/2012   Lab Results  Component Value Date   INSULIN 17.0 06/01/2023   Lab Results  Component Value Date   TSH 1.23 07/24/2016   Lab Results  Component Value Date   CHOL 219 (H) 10/27/2018   HDL 47.30 10/27/2018   LDLCALC 157 (H) 10/27/2018   LDLDIRECT 155.3 10/26/2012   TRIG 73.0 10/27/2018   CHOLHDL 5 10/27/2018   Lab Results  Component Value Date   WBC 9.7 07/09/2021   HGB 13.2 07/09/2021   HCT 39.1 07/09/2021   MCV 88.7 07/09/2021   PLT 185 07/09/2021   No results found for: "IRON", "TIBC", "FERRITIN"  Attestation Statements:   Reviewed by clinician on day of visit: allergies, medications, problem list, medical history, surgical history, family history, social history, and previous encounter notes.  Time spent on visit including pre-visit chart review and post-visit charting and care was 40 minutes.   Trude Mcburney, am acting as transcriptionist for Worthy Rancher, MD.  I have reviewed the above documentation for accuracy and completeness, and I agree with the above. -Worthy Rancher, MD

## 2023-06-01 NOTE — Assessment & Plan Note (Signed)
Patient has a slower than predicted metabolism. IC 2045 vs. calculated 2496.  Likely secondary to chronic skipping of meals and symptoms suspicious for obstructive sleep apnea.  This may contribute to weight gain, chronic fatigue and difficulty losing weight.  We reviewed measures to improve metabolism including not skipping meals, progressive strengthening exercises, increasing protein intake at every meal and maintaining adequate hydration and sleep.

## 2023-06-01 NOTE — Assessment & Plan Note (Signed)
 See obesity treatment plan

## 2023-06-01 NOTE — Assessment & Plan Note (Signed)
His blood pressure is uncontrolled.  I counseled him on the risk associated with elevated blood pressure.  I recommend that he check his blood pressure in the morning and also before bedtime over the next 3 days and to contact his PCP if blood pressure is above 140/90.  We discussed the benefits of lowering blood pressure to less than 130/80.  Losing 10% of body weight may improve blood pressure control.

## 2023-06-01 NOTE — Assessment & Plan Note (Signed)
I reviewed labs via patient portal for [facility].  He had blood glucose of 130 he states this was done fasting.  His hemoglobin A1c had been 5.4.  We are checking a fasting blood glucose today and insulin levels.

## 2023-06-01 NOTE — Assessment & Plan Note (Signed)
LDL is not at goal. Elevated LDL may be secondary to nutrition, genetics and spillover effect from excess adiposity. Recommended LDL goal is <70 to reduce the risk of fatty streaks and the progression to obstructive ASCVD in the future.   Reviewed labs using patient portal for [facility] his total cholesterol was 226 with an LDL of 166 this was from April.  His triglycerides were 83  He is not on statin therapy.  We will calculate cardiovascular risk at the next office visit for restratification.    Lab Results  Component Value Date   CHOL 219 (H) 10/27/2018   HDL 47.30 10/27/2018   LDLCALC 157 (H) 10/27/2018   LDLDIRECT 155.3 10/26/2012   TRIG 73.0 10/27/2018   CHOLHDL 5 10/27/2018    Continue weight loss therapy, losing 10% or more of body weight may improve condition. Also advised to reduce saturated fats in diet to less than 10% of daily calories.

## 2023-06-15 ENCOUNTER — Encounter (INDEPENDENT_AMBULATORY_CARE_PROVIDER_SITE_OTHER): Payer: Self-pay | Admitting: Internal Medicine

## 2023-06-15 ENCOUNTER — Telehealth (INDEPENDENT_AMBULATORY_CARE_PROVIDER_SITE_OTHER): Payer: Self-pay | Admitting: Internal Medicine

## 2023-06-15 ENCOUNTER — Ambulatory Visit (INDEPENDENT_AMBULATORY_CARE_PROVIDER_SITE_OTHER): Payer: BC Managed Care – PPO | Admitting: Internal Medicine

## 2023-06-15 VITALS — BP 150/94 | HR 98 | Temp 98.0°F | Ht 70.0 in | Wt 292.0 lb

## 2023-06-15 DIAGNOSIS — R7309 Other abnormal glucose: Secondary | ICD-10-CM | POA: Diagnosis not present

## 2023-06-15 DIAGNOSIS — K760 Fatty (change of) liver, not elsewhere classified: Secondary | ICD-10-CM

## 2023-06-15 DIAGNOSIS — Z6841 Body Mass Index (BMI) 40.0 and over, adult: Secondary | ICD-10-CM

## 2023-06-15 DIAGNOSIS — E559 Vitamin D deficiency, unspecified: Secondary | ICD-10-CM | POA: Diagnosis not present

## 2023-06-15 DIAGNOSIS — I10 Essential (primary) hypertension: Secondary | ICD-10-CM

## 2023-06-15 DIAGNOSIS — E538 Deficiency of other specified B group vitamins: Secondary | ICD-10-CM | POA: Diagnosis not present

## 2023-06-15 DIAGNOSIS — R29818 Other symptoms and signs involving the nervous system: Secondary | ICD-10-CM

## 2023-06-15 DIAGNOSIS — E66813 Obesity, class 3: Secondary | ICD-10-CM

## 2023-06-15 DIAGNOSIS — R948 Abnormal results of function studies of other organs and systems: Secondary | ICD-10-CM

## 2023-06-15 MED ORDER — VITAMIN B-12 1000 MCG PO TABS: 1000.0000 ug | ORAL_TABLET | Freq: Every day | ORAL | 0 refills | Status: AC

## 2023-06-15 MED ORDER — VITAMIN D (ERGOCALCIFEROL) 1.25 MG (50000 UNIT) PO CAPS: 50000.0000 [IU] | ORAL_CAPSULE | ORAL | 0 refills | Status: DC

## 2023-06-15 NOTE — Assessment & Plan Note (Signed)
Uncontrolled. Has missed some dosages. Does not like to take medications regularly

## 2023-06-15 NOTE — Progress Notes (Signed)
Office: [phone]  /  Fax: [phone]  WEIGHT SUMMARY AND BIOMETRICS  Vitals Temp: 98 F (36.7 C) BP: (!) 150/94 Pulse Rate: 98 SpO2: 98 %   Anthropometric Measurements Height: 5\' 10"  (1.778 m) Weight: 292 lb (132.5 kg) BMI (Calculated): 41.9 Weight at Last Visit: 297 lb Weight Lost Since Last Visit: 5 lb Starting Weight: 297 lb Total Weight Loss (lbs): 5 lb (2.268 kg) Peak Weight: 301 lb   Body Composition  Body Fat %: 39.3 % Fat Mass (lbs): 114.8 lbs Muscle Mass (lbs): 168.8 lbs Total Body Water (lbs): 119.6 lbs Visceral Fat Rating : 25    RMR: 2045  Today's Visit #: 2  Starting Date: 06/01/23   HPI  Chief Complaint: OBESITY  Daniel Rosales is here to discuss his progress with his obesity treatment plan. He is on the the Category 3 Plan and states he is following his eating plan approximately 70 % of the time. He states he is exercising 30 minutes 2 times per week.  Interval History:  Since last office visit he has lost 5 pounds.  BIA information shows a reduction in body fat percentage and preservation of muscle mass. He reports good adherence to reduced calorie nutritional plan. He has been working on not skipping meals, increasing protein intake at every meal, drinking more water, avoiding or reducing simple and processed carbohydrates, making healthier choices, and working on gradual implementation of reduced calorie nutrition plan  Orixegenic Control: Reports problems with appetite and hunger signals.  Reports problems with satiety and satiation.  Denies problems with eating patterns and portion control.  Denies abnormal cravings. Denies feeling deprived or restricted.   Barriers identified: work schedule and inadequate sleep.   Pharmacotherapy for weight loss: He is currently taking no anti-obesity medication.    ASSESSMENT AND PLAN  TREATMENT PLAN FOR OBESITY:  Recommended Dietary Goals  Daniel Rosales is currently in the action stage of change. As  such, his goal is to continue weight management plan. He has agreed to: continue current plan  Behavioral Intervention  We discussed the following Behavioral Modification Strategies today: increasing lean protein intake, decreasing simple carbohydrates , increasing vegetables, increasing lower glycemic fruits, increasing fiber rich foods, avoiding skipping meals, increasing water intake, work on meal planning and preparation, continue to practice mindfulness when eating, and planning for success.  Additional resources provided today: None  Recommended Physical Activity Goals  Daniel Rosales has been advised to work up to 150 minutes of moderate intensity aerobic activity a week and strengthening exercises 2-3 times per week for cardiovascular health, weight loss maintenance and preservation of muscle mass.   He has agreed to :  Think about ways to increase daily physical activity and overcoming barriers to exercise and Increase physical activity in their day and reduce sedentary time (increase NEAT).  Pharmacotherapy We discussed various medication options to help Daniel Rosales with his weight loss efforts and we both agreed to :  We reviewed GLP-1 therapy with patient today.  ASSOCIATED CONDITIONS ADDRESSED TODAY  Hepatic steatosis Assessment & Plan: Asymptomatic.  I have reviewed previous imaging.  He had a CT scan done a few years ago for kidney stones which showed the presence of hepatic steatosis.  His liver enzymes are normal.  He does not drink alcohol or take over-the-counter supplements.  His fib 4 score is low.  Losing 15% of body weight as well as simple and processed carbs will help with condition.  Also treatment with incretin therapy.   Vitamin D deficiency Assessment &  Plan: Most recent vitamin D levels  Lab Results  Component Value Date   VD25OH 10.6 (L) 06/01/2023     Deficiency state associated with adiposity and may result in leptin resistance Plan: Insert vitamin D  supplementation   Orders: -     Vitamin D (Ergocalciferol); Take 1 capsule (50,000 Units total) by mouth every 7 (seven) days.  Dispense: 16 capsule; Refill: 0  B12 deficiency Assessment & Plan: His B12 levels was 190.  He may benefit from additional testing including methylmalonic acid and checking intrinsic factor as this may warrant further investigation.  Because of cost concerns we will start treatment with B12 1000 mcg daily for a month and then this could be reduced to every other day.  We will check methylmalonic acid at that time.  B12 deficiency may be misrepresenting his A1c levels.  Orders: -     Vitamin B-12; Take 1 tablet (1,000 mcg total) by mouth daily.  Dispense: 90 tablet; Refill: 0  Abnormal glucose Assessment & Plan: Patient has had 2 fasting blood glucose 1 above 130 another 122.  His hemoglobin A1c outside of network was reported to be normal.  I suspect the reason for discrepancy is because he is B12 deficient which could cause a lower than expected hemoglobin A1c.  He may benefit from a glucose tolerance test but because of work schedule this will be very time-consuming.  We discussed the benefits of pharmacoprophylaxis and at this time he declines treatment with metformin.  He will look into coverage for incretin therapy.  He has been working on reducing simple and added sugars in his diet and was congratulated on this.   Class 3 severe obesity with serious comorbidity and body mass index (BMI) of 40.0 to 44.9 in adult, unspecified obesity type [facility]) Assessment & Plan: See obesity treatment plan  Orders: -     Ambulatory referral to Sleep Studies  Essential hypertension Assessment & Plan: Uncontrolled. Has missed some dosages with blood pressure medication. Does not like to take medications regularly.  Patient counseled on the risk associated with uncontrolled blood pressure especially in high risk populations.  He will improve adherence and also begin self  monitoring at home.  I recommend that he check his blood pressure in the morning and also before bedtime.  If his blood pressures are above 140/90 he will reach out to his primary care team for treatment intensification.  His goal blood pressure should be less than 120/80.  Losing 10% of body weight may improve blood pressure control.  Continue with medically supervised weight management plan.  Orders: -     Ambulatory referral to Sleep Studies  Abnormal metabolism Assessment & Plan: Patient has a slower than predicted metabolism. IC 2045 vs. calculated 2496.  Likely secondary to chronic skipping of meals and symptoms suspicious for obstructive sleep apnea.  This may contribute to weight gain, chronic fatigue and difficulty losing weight.   We reviewed measures to improve metabolism including not skipping meals, progressive strengthening exercises, increasing protein intake at every meal and maintaining adequate hydration and sleep.   Patient will be referred for polysomnography.    Suspected sleep apnea -     Ambulatory referral to Sleep Studies    PHYSICAL EXAM:  Blood pressure (!) 150/94, pulse 98, temperature 98 F (36.7 C), height 5\' 10"  (1.778 m), weight 292 lb (132.5 kg), SpO2 98%. Body mass index is 41.9 kg/m.  General: He is overweight, cooperative, alert, well developed, and in no acute  distress. PSYCH: Has normal mood, affect and thought process.   HEENT: EOMI, sclerae are anicteric. Lungs: Normal breathing effort, no conversational dyspnea. Extremities: No edema.  Neurologic: No gross sensory or motor deficits. No tremors or fasciculations noted.    DIAGNOSTIC DATA REVIEWED:  BMET    Component Value Date/Time   NA 141 06/01/2023 0927   K 4.2 06/01/2023 0927   CL 103 06/01/2023 0927   CO2 26 06/01/2023 0927   GLUCOSE 122 (H) 06/01/2023 0927   GLUCOSE 137 (H) 07/09/2021 2311   BUN 10 06/01/2023 0927   CREATININE 0.72 (L) 06/01/2023 0927   CALCIUM 9.5  06/01/2023 0927   GFRNONAA >60 07/09/2021 2311   Lab Results  Component Value Date   HGBA1C 5.1 10/26/2012   Lab Results  Component Value Date   INSULIN 17.0 06/01/2023   Lab Results  Component Value Date   TSH 1.23 07/24/2016   CBC    Component Value Date/Time   WBC 9.7 07/09/2021 2311   RBC 4.41 07/09/2021 2311   HGB 13.2 07/09/2021 2311   HCT 39.1 07/09/2021 2311   PLT 185 07/09/2021 2311   MCV 88.7 07/09/2021 2311   MCH 29.9 07/09/2021 2311   MCHC 33.8 07/09/2021 2311   RDW 12.3 07/09/2021 2311   Iron Studies No results found for: "IRON", "TIBC", "FERRITIN", "IRONPCTSAT" Lipid Panel     Component Value Date/Time   CHOL 219 (H) 10/27/2018 1032   TRIG 73.0 10/27/2018 1032   HDL 47.30 10/27/2018 1032   CHOLHDL 5 10/27/2018 1032   VLDL 14.6 10/27/2018 1032   LDLCALC 157 (H) 10/27/2018 1032   LDLDIRECT 155.3 10/26/2012 0801   Hepatic Function Panel     Component Value Date/Time   PROT 6.8 06/01/2023 0927   ALBUMIN 4.4 06/01/2023 0927   AST 17 06/01/2023 0927   ALT 25 06/01/2023 0927   ALKPHOS 103 06/01/2023 0927   BILITOT 0.5 06/01/2023 0927   BILIDIR 0.1 07/24/2016 0819      Component Value Date/Time   TSH 1.23 07/24/2016 0819   Nutritional Lab Results  Component Value Date   VD25OH 10.6 (L) 06/01/2023     Return for 3 weeks with Daniel Rosales and 6 weeks with Daniel Rosales.Marland Kitchen He was informed of the importance of frequent follow up visits to maximize his success with intensive lifestyle modifications for his multiple health conditions.   ATTESTASTION STATEMENTS:  Reviewed by clinician on day of visit: allergies, medications, problem list, medical history, surgical history, family history, social history, and previous encounter notes.   I have spent 40 minutes in the care of the patient today including: preparing to see patient (e.g. review and interpretation of tests, old notes ), obtaining and/or reviewing separately obtained history, performing a  medically appropriate examination or evaluation, counseling and educating the patient, documenting clinical information in the electronic or other health care record, and independently interpreting results and communicating results to the patient, family, or caregiver   Worthy Rancher, MD

## 2023-06-15 NOTE — Telephone Encounter (Signed)
Patient called in to say he called his Insurance co, [employer] as suggested, to get prior authorization for the Miller and the rep at [employer] told him if the doctor's office would call the authorization would go much quicker. The phone number for the Insurance Berkley Harvey is [phone]  Thank you Vickie

## 2023-06-16 ENCOUNTER — Telehealth (INDEPENDENT_AMBULATORY_CARE_PROVIDER_SITE_OTHER): Payer: Self-pay

## 2023-06-16 NOTE — Assessment & Plan Note (Signed)
His B12 levels was 190.  He may benefit from additional testing including methylmalonic acid and checking intrinsic factor as this may warrant further investigation.  Because of cost concerns we will start treatment with B12 1000 mcg daily for a month and then this could be reduced to every other day.  We will check methylmalonic acid at that time.  B12 deficiency may be misrepresenting his A1c levels.

## 2023-06-16 NOTE — Assessment & Plan Note (Signed)
Asymptomatic.  I have reviewed previous imaging.  He had a CT scan done a few years ago for kidney stones which showed the presence of hepatic steatosis.  His liver enzymes are normal.  He does not drink alcohol or take over-the-counter supplements.  His fib 4 score is low.  Losing 15% of body weight as well as simple and processed carbs will help with condition.  Also treatment with incretin therapy.

## 2023-06-16 NOTE — Assessment & Plan Note (Signed)
Patient has had 2 fasting blood glucose 1 above 130 another 122.  His hemoglobin A1c outside of network was reported to be normal.  I suspect the reason for discrepancy is because he is B12 deficient which could cause a lower than expected hemoglobin A1c.  He may benefit from a glucose tolerance test but because of work schedule this will be very time-consuming.  We discussed the benefits of pharmacoprophylaxis and at this time he declines treatment with metformin.  He will look into coverage for incretin therapy.  He has been working on reducing simple and added sugars in his diet and was congratulated on this.

## 2023-06-16 NOTE — Assessment & Plan Note (Signed)
Patient has a slower than predicted metabolism. IC 2045 vs. calculated 2496.  Likely secondary to chronic skipping of meals and symptoms suspicious for obstructive sleep apnea.  This may contribute to weight gain, chronic fatigue and difficulty losing weight.   We reviewed measures to improve metabolism including not skipping meals, progressive strengthening exercises, increasing protein intake at every meal and maintaining adequate hydration and sleep.   Patient will be referred for polysomnography.

## 2023-06-16 NOTE — Telephone Encounter (Signed)
Spoke with pt, advised PA is complete, however, insurance is requiring for him to be in our program for 6 m prior to starting medications

## 2023-06-16 NOTE — Assessment & Plan Note (Signed)
Most recent vitamin D levels  Lab Results  Component Value Date   VD25OH 10.6 (L) 06/01/2023     Deficiency state associated with adiposity and may result in leptin resistance Plan: Insert vitamin D supplementation

## 2023-06-16 NOTE — Assessment & Plan Note (Signed)
 See obesity treatment plan

## 2023-06-17 ENCOUNTER — Telehealth (INDEPENDENT_AMBULATORY_CARE_PROVIDER_SITE_OTHER): Payer: Self-pay

## 2023-06-17 NOTE — Telephone Encounter (Signed)
PA for Wegovy has been denied.

## 2023-07-14 ENCOUNTER — Encounter (INDEPENDENT_AMBULATORY_CARE_PROVIDER_SITE_OTHER): Payer: Self-pay | Admitting: Physician Assistant

## 2023-07-14 ENCOUNTER — Ambulatory Visit (INDEPENDENT_AMBULATORY_CARE_PROVIDER_SITE_OTHER): Payer: BC Managed Care – PPO | Admitting: Physician Assistant

## 2023-07-14 VITALS — BP 169/107 | HR 70 | Temp 98.1°F | Ht 70.0 in | Wt 290.0 lb

## 2023-07-14 DIAGNOSIS — E559 Vitamin D deficiency, unspecified: Secondary | ICD-10-CM | POA: Diagnosis not present

## 2023-07-14 DIAGNOSIS — I10 Essential (primary) hypertension: Secondary | ICD-10-CM | POA: Diagnosis not present

## 2023-07-14 DIAGNOSIS — E538 Deficiency of other specified B group vitamins: Secondary | ICD-10-CM | POA: Diagnosis not present

## 2023-07-14 DIAGNOSIS — Z6841 Body Mass Index (BMI) 40.0 and over, adult: Secondary | ICD-10-CM

## 2023-07-14 MED ORDER — LOSARTAN POTASSIUM 50 MG PO TABS: 50.0000 mg | ORAL_TABLET | Freq: Every day | ORAL | 0 refills | Status: DC

## 2023-07-14 MED ORDER — VITAMIN D (ERGOCALCIFEROL) 1.25 MG (50000 UNIT) PO CAPS: 50000.0000 [IU] | ORAL_CAPSULE | ORAL | 0 refills | Status: DC

## 2023-07-14 NOTE — Progress Notes (Signed)
.smr  Office: [phone]  /  Fax: [phone]  WEIGHT SUMMARY AND BIOMETRICS  Vitals Temp: 98.1 F (36.7 C) BP: (!) 169/107 Pulse Rate: 70 SpO2: 98 %   Anthropometric Measurements Height: 5\' 10"  (1.778 m) Weight: 290 lb (131.5 kg) BMI (Calculated): 41.61 Weight at Last Visit: 292 lb Weight Lost Since Last Visit: 2 lb Weight Gained Since Last Visit: 0 Starting Weight: 297 lb Total Weight Loss (lbs): 7 lb (3.175 kg) Peak Weight: 301 lb Waist Measurement : 60 inches   Body Composition  Body Fat %: 39 % Fat Mass (lbs): 113.4 lbs Muscle Mass (lbs): 168.8 lbs Total Body Water (lbs): 119.4 lbs Visceral Fat Rating : 25   Other Clinical Data RMR: 2045 Fasting: no Labs: no Today's Visit #: 3 Starting Date: 06/01/23     HPI  Chief Complaint: OBESITY  Daniel Rosales is here to discuss his progress with his obesity treatment plan. He is on the the Category 3 Plan and states he is following his eating plan approximately 50 % of the time. He states he is exercising/walking at work at least 8200 steps daily 5 times per week.   Interval History:  Since last office visit he is down 2 lbs.   The patient, a 57 year old with obesity, vitamin D and B12 deficiency, and hypertension, presents for a follow-up visit.  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  Despite this progress, he has been struggling with meal planning and preparation, often skipping breakfast and not eating a proper lunch. He has been trying to incorporate healthier options into his diet, such as protein shakes and eggs with vegetables. The patient also reports needing a refill for his hypertension medication.  Pharmacotherapy: None for weight loss  Sleep referral is pending.   TREATMENT PLAN FOR OBESITY:  Recommended Dietary Goals  Eliot is currently in the action stage of change. As such, his goal is to  continue weight management plan. He has agreed to the Category 3 Plan.  Behavioral Intervention  We discussed the following Behavioral Modification Strategies today: increasing lean protein intake, decreasing simple carbohydrates , increasing vegetables, increasing lower glycemic fruits, avoiding skipping meals, increasing water intake, work on meal planning and preparation, keeping healthy foods at home, decreasing eating out or consumption of processed foods, and making healthy choices when eating convenient foods, continue to practice mindfulness when eating, planning for success, and better snacking choices.  Additional resources provided today: NA  Recommended Physical Activity Goals  Jahrell has been advised to work up to 150 minutes of moderate intensity aerobic activity a week and strengthening exercises 2-3 times per week for cardiovascular health, weight loss maintenance and preservation of muscle mass.   He has agreed to Continue current level of physical activity    Pharmacotherapy We discussed various medication options to help Rajan with his weight loss efforts and we both agreed to continue to work on nutritional and behavioral strategies to promote weight loss.  .    No follow-ups on file.Marland Kitchen He was informed of the importance of frequent follow up visits to maximize his success with intensive lifestyle modifications for his multiple health conditions.  PHYSICAL EXAM:  Blood pressure (!) 169/107, pulse 70, temperature 98.1 F (36.7 C), height 5\' 10"  (1.778 m), weight 290 lb (131.5 kg), SpO2 98%. Body mass index is 41.61 kg/m.  General: He is overweight, cooperative, alert,  well developed, and in no acute distress. PSYCH: Has normal mood, affect and thought process.   Cardiovascular: HR 70's BP 169/107- reports has not been taking/ran out of BP medications  Lungs: Normal breathing effort, no conversational dyspnea.  DIAGNOSTIC DATA REVIEWED:  BMET    Component Value  Date/Time   NA 141 06/01/2023 0927   K 4.2 06/01/2023 0927   CL 103 06/01/2023 0927   CO2 26 06/01/2023 0927   GLUCOSE 122 (H) 06/01/2023 0927   GLUCOSE 137 (H) 07/09/2021 2311   BUN 10 06/01/2023 0927   CREATININE 0.72 (L) 06/01/2023 0927   CALCIUM 9.5 06/01/2023 0927   GFRNONAA >60 07/09/2021 2311   Lab Results  Component Value Date   HGBA1C 5.1 10/26/2012   Lab Results  Component Value Date   INSULIN 17.0 06/01/2023   Lab Results  Component Value Date   TSH 1.23 07/24/2016   CBC    Component Value Date/Time   WBC 9.7 07/09/2021 2311   RBC 4.41 07/09/2021 2311   HGB 13.2 07/09/2021 2311   HCT 39.1 07/09/2021 2311   PLT 185 07/09/2021 2311   MCV 88.7 07/09/2021 2311   MCH 29.9 07/09/2021 2311   MCHC 33.8 07/09/2021 2311   RDW 12.3 07/09/2021 2311   Iron Studies No results found for: "IRON", "TIBC", "FERRITIN", "IRONPCTSAT" Lipid Panel     Component Value Date/Time   CHOL 219 (H) 10/27/2018 1032   TRIG 73.0 10/27/2018 1032   HDL 47.30 10/27/2018 1032   CHOLHDL 5 10/27/2018 1032   VLDL 14.6 10/27/2018 1032   LDLCALC 157 (H) 10/27/2018 1032   LDLDIRECT 155.3 10/26/2012 0801   Hepatic Function Panel     Component Value Date/Time   PROT 6.8 06/01/2023 0927   ALBUMIN 4.4 06/01/2023 0927   AST 17 06/01/2023 0927   ALT 25 06/01/2023 0927   ALKPHOS 103 06/01/2023 0927   BILITOT 0.5 06/01/2023 0927   BILIDIR 0.1 07/24/2016 0819      Component Value Date/Time   TSH 1.23 07/24/2016 0819   Nutritional Lab Results  Component Value Date   VD25OH 10.6 (L) 06/01/2023    ASSOCIATED CONDITIONS ADDRESSED TODAY  ASSESSMENT AND PLAN  Problem List Items Addressed This Visit     Vitamin D deficiency  Obesity Continued weight loss with a decrease in adipose tissue and maintenance of muscle mass. Discussed the importance of consistent meal planning and preparation. -Continue current exercise regimen at work and tracking steps. -Consider incorporating prepared  protein meals for convenience. -Continue to monitor portion sizes, particularly with snacks like pistachios and flatbread crisps.  Hypertension Elevated blood pressure today, patient has been off medication for an unspecified duration. -Refill Losartan 50 mg daily for one month. -Encouraged patient to maintain consistent use of medication due to risk of damage from untreated hypertension.  Vitamin D Deficiency Patient is compliant with supplementation Ergocalciferol 50,000 units weekly, no reported issues. -Continue/refill Ergocalciferol 50,000 units weekly.  Low vitamin D levels can be associated with adiposity and may result in leptin resistance and weight gain. Also associated with fatigue. Currently on vitamin D supplementation without any adverse effects.     Vitamin B12 Deficiency Patient is compliant with supplementation, no reported issues. Discussed potential symptoms of deficiency and the importance of consistent supplementation.  -Continue current Vitamin B12 1000 mcg daily supplementation regimen.  Follow-up with Dr. Rikki Spearing is already scheduled.  ATTESTASTION STATEMENTS:  Reviewed by clinician on day of visit: allergies, medications, problem list, medical history, surgical history,  family history, social history, and previous encounter notes.   I have personally spent 30 minutes total time today in preparation, patient care, nutritional counseling and documentation for this visit, including the following: review of clinical lab tests; review of medical tests/procedures/services.      Daniel Durnil, PA-C

## 2023-07-29 ENCOUNTER — Encounter: Payer: Self-pay | Admitting: Neurology

## 2023-07-29 ENCOUNTER — Ambulatory Visit: Payer: BC Managed Care – PPO | Admitting: Neurology

## 2023-07-29 VITALS — BP 153/92 | HR 77 | Ht 70.0 in | Wt 298.2 lb

## 2023-07-29 DIAGNOSIS — R0683 Snoring: Secondary | ICD-10-CM

## 2023-07-29 DIAGNOSIS — Z9189 Other specified personal risk factors, not elsewhere classified: Secondary | ICD-10-CM

## 2023-07-29 DIAGNOSIS — R351 Nocturia: Secondary | ICD-10-CM

## 2023-07-29 DIAGNOSIS — R030 Elevated blood-pressure reading, without diagnosis of hypertension: Secondary | ICD-10-CM

## 2023-07-29 NOTE — Patient Instructions (Signed)

## 2023-07-29 NOTE — Progress Notes (Signed)
Subjective:    Patient ID: Daniel Rosales is a 57 y.o. male.  HPI    Huston Foley, MD, PhD [facility] [address], [address] [address] [place], [place] [address]  Dear Dr. Rikki Spearing,   I saw your patient, [name], upon your kind request in my sleep clinic today for initial consultation of his sleep disorder, in particular, concern for underlying obstructive sleep apnea.  The patient is unaccompanied today.  As you know, Mr. [name] is a 57 year old male with an underlying medical history of hypertension, hyperlipidemia, hepatic steatosis, vitamin B12 deficiency, vitamin D deficiency, and severe obesity with a BMI of over 40, who reports snoring and excessive daytime somnolence.  His Epworth sleepiness score is 9 out of 24, fatigue severity score is 14 out of 63.  I reviewed your office note from 06/15/2023.  He has nocturia about once per average night, denies recurrent nocturnal or morning headaches.  He has a maternal uncle with sleep apnea.  He lives with his wife and younger daughter age 6.  His older daughter is in [place].  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  He is a non-smoker and drinks alcohol very infrequently, on special occasions, no daily caffeine.  He has a dog in the household, his Rottweiler typically sleeps on the bed with them.  He has a TV in his bedroom and has it on a sleep timer at night.  Bedtime is generally between 10 and 11 and time around 5:30 AM.  His Past Medical History Is Significant For: Past Medical History:  Diagnosis Date   ED (erectile dysfunction)    Hyperlipidemia    Hypertension    Sleep apnea     His Past Surgical History Is Significant For: Past Surgical History:  Procedure Laterality Date   CIRCUMCISION  at age 53    His Family History Is Significant For: Family History  Problem Relation Age of Onset   Diabetes type II Mother    Depression Mother    Diabetes Father        amputuation    Hypertension Father    Obesity Father    Kidney disease Sister    Kidney disease Sister        renal failure. unclear cause   Hypertension Sister    Diabetes type II Sister    Diabetes type II Brother    Colon cancer Neg Hx    Esophageal cancer Neg Hx    Stomach cancer Neg Hx    Rectal cancer Neg Hx    Colon polyps Neg Hx     His Social History Is Significant For: Social History   Socioeconomic History   Marital status: Married    Spouse name: Britta Mccreedy   Number of children: Not on file   Years of education: Not on file   Highest education level: Not on file  Occupational History   Occupation: Sr Hotel manager  Tobacco Use   Smoking status: Never   Smokeless tobacco: Never  Vaping Use   Vaping status: Never Used  Substance and Sexual Activity   Alcohol use: Yes    Comment: occ.    Drug use: No   Sexual activity: Not on file  Other Topics Concern   Not on file  Social History Narrative   Divorced but back together with wife. 2 daughters (70 and 21).       Works for time Psychologist, forensic, daytime  (warehouse)  Hobbies: enjoys Psychiatrist; rare.    Social Determinants of Health   Financial Resource Strain: Not on file  Food Insecurity: Not on file  Transportation Needs: Not on file  Physical Activity: Not on file  Stress: Not on file  Social Connections: Not on file    His Allergies Are:  No Known Allergies:   His Current Medications Are:  Outpatient Encounter Medications as of 07/29/2023  Medication Sig   cyanocobalamin (VITAMIN B12) 1000 MCG tablet Take 1 tablet (1,000 mcg total) by mouth daily.   losartan (COZAAR) 50 MG tablet Take 1 tablet (50 mg total) by mouth at bedtime.   Vitamin D, Ergocalciferol, (DRISDOL) 1.25 MG (50000 UNIT) CAPS capsule Take 1 capsule (50,000 Units total) by mouth every 7 (seven) days.   No facility-administered encounter medications on file as of 07/29/2023.  :   Review of Systems:  Out of a complete 14 point  review of systems, all are reviewed and negative with the exception of these symptoms as listed below:  Review of Systems  Neurological:        NP / Internal / hypertension, obesity, suspected sleep apnea, snoring.  ESS 9, FSS 14.     Objective:  Neurological Exam  Physical Exam Physical Examination:   Vitals:   07/29/23 1500  BP: (!) 153/92  Pulse: 77    General Examination: The patient is a very pleasant 57 y.o. male in no acute distress. He appears well-developed and well-nourished and well groomed.  No symptoms from elevated blood pressure value today, reports that he is consistent with his losartan, takes it at night typically.  HEENT: Normocephalic, atraumatic, pupils are equal, round and reactive to light, extraocular tracking is good without limitation to gaze excursion or nystagmus noted. Hearing is grossly intact. Face is symmetric with normal facial animation. Speech is clear with no dysarthria noted. There is no hypophonia. There is no lip, neck/head, jaw or voice tremor. Neck is supple with full range of passive and active motion. There are no carotid bruits on auscultation. Oropharynx exam reveals: mild mouth dryness, good dental hygiene and mild to moderate airway crowding, due to small airway entry and redundant soft palate, tonsillar size of about 1-2+ bilaterally.  Neck circumference 16 inches, mild overbite noted.  Tongue protrudes centrally and palate elevates symmetrically  Chest: Clear to auscultation without wheezing, rhonchi or crackles noted.  Heart: S1+S2+0, regular and normal without murmurs, rubs or gallops noted.   Abdomen: Soft, non-tender and non-distended.  Extremities: There is trace edema in the right shin area.   Skin: Warm and dry without trophic changes noted.   Musculoskeletal: exam reveals no obvious joint deformities.   Neurologically:  Mental status: The patient is awake, alert and oriented in all 4 spheres. His immediate and remote  memory, attention, language skills and fund of knowledge are appropriate. There is no evidence of aphasia, agnosia, apraxia or anomia. Speech is clear with normal prosody and enunciation. Thought process is linear. Mood is normal and affect is normal.  Cranial nerves II - XII are as described above under HEENT exam.  Motor exam: Normal bulk, strength and tone is noted. There is no obvious action or resting tremor.  Fine motor skills and coordination: grossly intact.  Cerebellar testing: No dysmetria or intention tremor. There is no truncal or gait ataxia.  Sensory exam: intact to light touch in the upper and lower extremities.  Gait, station and balance: He stands easily. No  veering to one side is noted. No leaning to one side is noted. Posture is age-appropriate and stance is narrow based. Gait shows normal stride length and normal pace. No problems turning are noted.   Assessment and Plan:  In summary, ROC STREETT is a very pleasant 57 y.o.-year old male with an underlying medical history of hypertension, hyperlipidemia, hepatic steatosis, vitamin B12 deficiency, vitamin D deficiency, and severe obesity with a BMI of over 40, whose history and physical exam are concerning for sleep disordered breathing, particularly obstructive sleep apnea (OSA).  While a laboratory attended sleep study is typically considered "gold standard" for evaluation of sleep disordered breathing, we mutually agreed to proceed with a home sleep test at this time.   I had a long chat with the patient about my findings and the diagnosis of sleep apnea, particularly OSA, its prognosis and treatment options. We talked about medical/conservative treatments, surgical interventions and non-pharmacological approaches for symptom control. I explained, in particular, the risks and ramifications of untreated moderate to severe OSA, especially with respect to developing cardiovascular disease down the road, including congestive heart  failure (CHF), difficult to treat hypertension, cardiac arrhythmias (particularly A-fib), neurovascular complications including TIA, stroke and dementia. Even type 2 diabetes has, in part, been linked to untreated OSA. Symptoms of untreated OSA may include (but may not be limited to) daytime sleepiness, nocturia (i.e. frequent nighttime urination), memory problems, mood irritability and suboptimally controlled or worsening mood disorder such as depression and/or anxiety, lack of energy, lack of motivation, physical discomfort, as well as recurrent headaches, especially morning or nocturnal headaches. We talked about the importance of maintaining a healthy lifestyle and striving for healthy weight.  In addition, we talked about the importance of striving for and maintaining good sleep hygiene. I recommended a sleep study at this time. I outlined the differences between a laboratory attended sleep study which is considered more comprehensive and accurate over the option of a home sleep test (HST); the latter may lead to underestimation of sleep disordered breathing in some instances and does not help with diagnosing upper airway resistance syndrome and is not accurate enough to diagnose primary central sleep apnea typically. I outlined possible surgical and non-surgical treatment options of OSA, including the use of a positive airway pressure (PAP) device (i.e. CPAP, AutoPAP/APAP or BiPAP in certain circumstances), a custom-made dental device (aka oral appliance, which would require a referral to a specialist dentist or orthodontist typically, and is generally speaking not considered for patients with full dentures or edentulous state), upper airway surgical options, such as traditional UPPP (which is not considered a first-line treatment) or the Inspire device (hypoglossal nerve stimulator, which would involve a referral for consultation with an ENT surgeon, after careful selection, following inclusion criteria -  also not first-line treatment). I explained the PAP treatment option to the patient in detail, as this is generally considered first-line treatment.  The patient indicated that he would be willing to try PAP therapy, if the need arises. I explained the importance of being compliant with PAP treatment, not only for insurance purposes but primarily to improve patient's symptoms symptoms, and for the patient's long term health benefit, including to reduce His cardiovascular risks longer-term.    We will pick up our discussion about the next steps and treatment options after testing.  We will keep him posted as to the test results by phone call and/or MyChart messaging where possible.  We will plan to follow-up in sleep clinic accordingly as  well.  I answered all his questions today and the patient was in agreement.   I encouraged him to call with any interim questions, concerns, problems or updates or email [place] through MyChart.  Generally speaking, sleep test authorizations may take up to 2 weeks, sometimes less, sometimes longer, the patient is encouraged to get in touch with [place] if they do not hear back from the sleep lab staff directly within the next 2 weeks.  Thank you very much for allowing me to participate in the care of this nice patient. If I can be of any further assistance to you please do not hesitate to call me at [phone].  Sincerely,   Huston Foley, MD, PhD

## 2023-08-04 ENCOUNTER — Encounter (INDEPENDENT_AMBULATORY_CARE_PROVIDER_SITE_OTHER): Payer: Self-pay | Admitting: Internal Medicine

## 2023-08-04 ENCOUNTER — Ambulatory Visit (INDEPENDENT_AMBULATORY_CARE_PROVIDER_SITE_OTHER): Payer: BC Managed Care – PPO | Admitting: Internal Medicine

## 2023-08-04 VITALS — BP 163/100 | HR 64 | Temp 98.1°F | Ht 70.0 in | Wt 292.0 lb

## 2023-08-04 DIAGNOSIS — I10 Essential (primary) hypertension: Secondary | ICD-10-CM | POA: Diagnosis not present

## 2023-08-04 DIAGNOSIS — R948 Abnormal results of function studies of other organs and systems: Secondary | ICD-10-CM | POA: Diagnosis not present

## 2023-08-04 DIAGNOSIS — E66813 Obesity, class 3: Secondary | ICD-10-CM | POA: Diagnosis not present

## 2023-08-04 DIAGNOSIS — Z6841 Body Mass Index (BMI) 40.0 and over, adult: Secondary | ICD-10-CM

## 2023-08-04 DIAGNOSIS — K760 Fatty (change of) liver, not elsewhere classified: Secondary | ICD-10-CM | POA: Diagnosis not present

## 2023-08-04 MED ORDER — LOSARTAN POTASSIUM-HCTZ 50-12.5 MG PO TABS: 1.0000 | ORAL_TABLET | Freq: Every day | ORAL | 0 refills | Status: DC

## 2023-08-04 MED ORDER — AMLODIPINE BESYLATE 2.5 MG PO TABS: 2.5000 mg | ORAL_TABLET | Freq: Every day | ORAL | 0 refills | Status: DC

## 2023-08-04 NOTE — Progress Notes (Unsigned)
Office: [phone]  /  Fax: [phone]  WEIGHT SUMMARY AND BIOMETRICS  Vitals Temp: 98.1 F (36.7 C) BP: (!) 163/100 Pulse Rate: 64 SpO2: 98 %   Anthropometric Measurements Height: 5\' 10"  (1.778 m) Weight: 292 lb (132.5 kg) BMI (Calculated): 41.9 Weight at Last Visit: 290 lb Weight Lost Since Last Visit: 98.1 Weight Gained Since Last Visit: 0 lb Starting Weight: 2lb Total Weight Loss (lbs): 5 lb (2.268 kg) Peak Weight: 301 lb   Body Composition  Body Fat %: 39.7 % Fat Mass (lbs): 116.2 lbs Muscle Mass (lbs): 168 lbs Total Body Water (lbs): 119.8 lbs Visceral Fat Rating : 25    RMR: 2045  Today's Visit #: 4  Starting Date: 06/01/23   HPI  Chief Complaint: OBESITY  Daniel Rosales is here to discuss his progress with his obesity treatment plan. He is on the the Category 3 Plan and states he is following his eating plan approximately 50 % of the time. He states he is not exercising..  Interval History:  Since last office visit he has {emweight change:30888}. He reports {EMADHERENCE:28838::"good adherence to reduced calorie nutritional plan."} He has been working on [employer] labels","not skipping meals","increasing protein intake at every meal","drinking more water","making healthier choices","reducing portion sizes","incorporating more whole foods"}  Orexigenic Control: {ACTIONS;DENIES/REPORTS:21021675::"Denies"} problems with appetite and hunger signals.  {ACTIONS;DENIES/REPORTS:21021675::"Denies"} problems with satiety and satiation.  {ACTIONS;DENIES/REPORTS:21021675::"Denies"} problems with eating patterns and portion control.  {ACTIONS;DENIES/REPORTS:21021675::"Denies"} abnormal cravings. {ACTIONS;DENIES/REPORTS:21021675::"Denies"} feeling deprived or restricted.   Barriers identified: {EMOBESITYBARRIERS:28841::"none"}.   Pharmacotherapy for weight loss: He is currently taking {EMPharmaco:28845}.    ASSESSMENT AND  PLAN  TREATMENT PLAN FOR OBESITY:  Recommended Dietary Goals  Lawayne is currently in the action stage of change. As such, his goal is to continue weight management plan. He has agreed to: {EMWTLOSSPLAN:29297::"continue current plan"}  Behavioral Intervention  We discussed the following Behavioral Modification Strategies today: {EMWMwtlossstrategies:28914::"continue to work on maintaining a reduced calorie state, getting the recommended amount of protein, incorporating whole foods, making healthy choices, staying well hydrated and practicing mindfulness when eating."}.  Additional resources provided today: {EMadditionalresources:29169::"None"}  Recommended Physical Activity Goals  Zollie has been advised to work up to 150 minutes of moderate intensity aerobic activity a week and strengthening exercises 2-3 times per week for cardiovascular health, weight loss maintenance and preservation of muscle mass.   He has agreed to :  {EMEXERCISE:28847::"Think about enjoyable ways to increase daily physical activity and overcoming barriers to exercise","Increase physical activity in their day and reduce sedentary time (increase NEAT)."}  Pharmacotherapy We discussed various medication options to help Kamareon with his weight loss efforts and we both agreed to : {EMagreedrx:29170::"continue with nutritional and behavioral strategies"}  ASSOCIATED CONDITIONS ADDRESSED TODAY  There are no diagnoses linked to this encounter.  PHYSICAL EXAM:  Blood pressure (!) 163/100, pulse 64, temperature 98.1 F (36.7 C), height 5\' 10"  (1.778 m), weight 292 lb (132.5 kg), SpO2 98%. Body mass index is 41.9 kg/m.  General: He is overweight, cooperative, alert, well developed, and in no acute distress. PSYCH: Has normal mood, affect and thought process.   HEENT: EOMI, sclerae are anicteric. Lungs: Normal breathing effort, no conversational dyspnea. Extremities: No edema.  Neurologic: No gross sensory or motor  deficits. No tremors or fasciculations noted.    DIAGNOSTIC DATA REVIEWED:  BMET    Component Value Date/Time   NA 141 06/01/2023 0927   K 4.2 06/01/2023 0927   CL 103 06/01/2023 0927   CO2 26 06/01/2023 0927  GLUCOSE 122 (H) 06/01/2023 0927   GLUCOSE 137 (H) 07/09/2021 2311   BUN 10 06/01/2023 0927   CREATININE 0.72 (L) 06/01/2023 0927   CALCIUM 9.5 06/01/2023 0927   GFRNONAA >60 07/09/2021 2311   Lab Results  Component Value Date   HGBA1C 5.1 10/26/2012   Lab Results  Component Value Date   INSULIN 17.0 06/01/2023   Lab Results  Component Value Date   TSH 1.23 07/24/2016   CBC    Component Value Date/Time   WBC 9.7 07/09/2021 2311   RBC 4.41 07/09/2021 2311   HGB 13.2 07/09/2021 2311   HCT 39.1 07/09/2021 2311   PLT 185 07/09/2021 2311   MCV 88.7 07/09/2021 2311   MCH 29.9 07/09/2021 2311   MCHC 33.8 07/09/2021 2311   RDW 12.3 07/09/2021 2311   Iron Studies No results found for: "IRON", "TIBC", "FERRITIN", "IRONPCTSAT" Lipid Panel     Component Value Date/Time   CHOL 219 (H) 10/27/2018 1032   TRIG 73.0 10/27/2018 1032   HDL 47.30 10/27/2018 1032   CHOLHDL 5 10/27/2018 1032   VLDL 14.6 10/27/2018 1032   LDLCALC 157 (H) 10/27/2018 1032   LDLDIRECT 155.3 10/26/2012 0801   Hepatic Function Panel     Component Value Date/Time   PROT 6.8 06/01/2023 0927   ALBUMIN 4.4 06/01/2023 0927   AST 17 06/01/2023 0927   ALT 25 06/01/2023 0927   ALKPHOS 103 06/01/2023 0927   BILITOT 0.5 06/01/2023 0927   BILIDIR 0.1 07/24/2016 0819      Component Value Date/Time   TSH 1.23 07/24/2016 0819   Nutritional Lab Results  Component Value Date   VD25OH 10.6 (L) 06/01/2023     No follow-ups on file.Marland Kitchen He was informed of the importance of frequent follow up visits to maximize his success with intensive lifestyle modifications for his multiple health conditions.   ATTESTASTION STATEMENTS:  Reviewed by clinician on day of visit: allergies, medications, problem  list, medical history, surgical history, family history, social history, and previous encounter notes.     Worthy Rancher, MD

## 2023-08-05 NOTE — Assessment & Plan Note (Signed)
Patient has a slower than predicted metabolism. IC 2045 vs. calculated 2496.  Likely secondary to chronic skipping of meals and symptoms suspicious for obstructive sleep apnea.  This may contribute to weight gain, chronic fatigue and difficulty losing weight.   We reviewed measures to improve metabolism including not skipping meals, progressive strengthening exercises, increasing protein intake at every meal and maintaining adequate hydration and sleep.   Patient will be referred for polysomnography.

## 2023-08-05 NOTE — Assessment & Plan Note (Signed)
His blood pressure is uncontrolled.  I reviewed vital signs flowsheet it has been uncontrolled for several months.  He is currently on losartan 50 mg once a day and denies problems with adherence or medication side effects.  He reports having a difficult time getting into see his PCP we will therefore take the liberty to increase his blood pressure medications while he gets a follow-up appointment.  I will also send an electronic correspondence to his primary care so they can schedule a follow-up appointment.  We will add hydrochlorothiazide 12.5 mg to losartan and start amlodipine 2.5 mg in the morning.  Patient advised on monitoring blood pressure in the morning and also before evening.  We reviewed goals of care as well as the risk associated with uncontrolled blood pressure.

## 2023-08-05 NOTE — Assessment & Plan Note (Signed)
Asymptomatic.  I have reviewed previous imaging.  He had a CT scan done a few years ago for kidney stones which showed the presence of hepatic steatosis.  His liver enzymes are normal.  He does not drink alcohol or take over-the-counter supplements.  His fib 4 score is low.  Losing 15% of body weight as well as simple and processed carbs will help with condition.  Also treatment with incretin therapy.

## 2023-08-05 NOTE — Assessment & Plan Note (Signed)
 See obesity treatment plan

## 2023-08-23 ENCOUNTER — Ambulatory Visit (INDEPENDENT_AMBULATORY_CARE_PROVIDER_SITE_OTHER): Payer: BC Managed Care – PPO | Admitting: Neurology

## 2023-08-23 DIAGNOSIS — R351 Nocturia: Secondary | ICD-10-CM

## 2023-08-23 DIAGNOSIS — G4733 Obstructive sleep apnea (adult) (pediatric): Secondary | ICD-10-CM

## 2023-08-23 DIAGNOSIS — Z9189 Other specified personal risk factors, not elsewhere classified: Secondary | ICD-10-CM

## 2023-08-23 DIAGNOSIS — R0683 Snoring: Secondary | ICD-10-CM

## 2023-08-23 DIAGNOSIS — R030 Elevated blood-pressure reading, without diagnosis of hypertension: Secondary | ICD-10-CM

## 2023-08-25 ENCOUNTER — Ambulatory Visit (INDEPENDENT_AMBULATORY_CARE_PROVIDER_SITE_OTHER): Payer: BC Managed Care – PPO | Admitting: Internal Medicine

## 2023-08-25 ENCOUNTER — Encounter (INDEPENDENT_AMBULATORY_CARE_PROVIDER_SITE_OTHER): Payer: Self-pay | Admitting: Internal Medicine

## 2023-08-25 VITALS — BP 145/95 | HR 66 | Temp 97.9°F | Ht 70.0 in | Wt 290.0 lb

## 2023-08-25 DIAGNOSIS — I10 Essential (primary) hypertension: Secondary | ICD-10-CM

## 2023-08-25 DIAGNOSIS — Z6841 Body Mass Index (BMI) 40.0 and over, adult: Secondary | ICD-10-CM

## 2023-08-25 DIAGNOSIS — K760 Fatty (change of) liver, not elsewhere classified: Secondary | ICD-10-CM | POA: Diagnosis not present

## 2023-08-25 DIAGNOSIS — E88819 Insulin resistance, unspecified: Secondary | ICD-10-CM | POA: Diagnosis not present

## 2023-08-25 DIAGNOSIS — R948 Abnormal results of function studies of other organs and systems: Secondary | ICD-10-CM

## 2023-08-25 DIAGNOSIS — E66813 Obesity, class 3: Secondary | ICD-10-CM

## 2023-08-25 MED ORDER — AMLODIPINE BESYLATE 5 MG PO TABS: 5.0000 mg | ORAL_TABLET | Freq: Every day | ORAL | 0 refills | Status: DC

## 2023-08-25 MED ORDER — LOSARTAN POTASSIUM-HCTZ 50-12.5 MG PO TABS: 1.0000 | ORAL_TABLET | Freq: Every day | ORAL | 0 refills | Status: DC

## 2023-08-25 MED ORDER — METFORMIN HCL ER 500 MG PO TB24: 500.0000 mg | ORAL_TABLET | Freq: Every day | ORAL | 0 refills | Status: DC

## 2023-08-25 NOTE — Assessment & Plan Note (Signed)
His blood pressure is improved but still not at goal.  He reports having difficulty getting an appointment with primary care.  He will continue on losartan hydrochlorothiazide at current dose I am increasing his amlodipine to 5 mg once daily.

## 2023-08-25 NOTE — Assessment & Plan Note (Signed)
Asymptomatic.  I have reviewed previous imaging.  He had a CT scan done a few years ago for kidney stones which showed the presence of hepatic steatosis.  His liver enzymes are normal.  He does not drink alcohol or take over-the-counter supplements.  His fib 4 score is low.  Losing 15% of body weight as well as simple and processed carbs will help with condition.  Also treatment with incretin therapy.

## 2023-08-25 NOTE — Assessment & Plan Note (Signed)
Patient has a slower than predicted metabolism. IC 2045 vs. calculated 2496.  Likely secondary to chronic skipping of meals and symptoms suspicious for obstructive sleep apnea.  This may contribute to weight gain, chronic fatigue and difficulty losing weight.  We reviewed measures to improve metabolism including not skipping meals, progressive strengthening exercises, increasing protein intake at every meal and maintaining adequate hydration and sleep.

## 2023-08-25 NOTE — Progress Notes (Signed)
Office: [phone]  /  Fax: [phone]  WEIGHT SUMMARY AND BIOMETRICS  Vitals Temp: 97.9 F (36.6 C) BP: (!) 145/95 Pulse Rate: 66 SpO2: 99 %   Anthropometric Measurements Height: 5\' 10"  (1.778 m) Weight: 290 lb (131.5 kg) BMI (Calculated): 41.61 Weight at Last Visit: 292 lb Weight Lost Since Last Visit: 2 lb Weight Gained Since Last Visit: 0 lb Starting Weight: 297 lb Total Weight Loss (lbs): 7 lb (3.175 kg) Peak Weight: 301 lb   Body Composition  Body Fat %: 39.8 % Fat Mass (lbs): 115.8 lbs Muscle Mass (lbs): 166.4 lbs Total Body Water (lbs): 120 lbs Visceral Fat Rating : 25    RMR: 2045  Today's Visit #: 5  Starting Date: 05/20/23   HPI  Chief Complaint: OBESITY  Daniel Rosales is here to discuss his progress with his obesity treatment plan. He is on the the Category 3 Plan and states he is following his eating plan approximately 20 % of the time. He states he is walking 30-60 minutes 2 times per week.  Interval History:   Discussed the use of AI scribe software for clinical note transcription with the patient, who gave verbal consent to proceed.  History of Present Illness    Patient presents today for follow-up consultation on weight management.  He reports a recent improvement in blood pressure and a modest weight loss of two pounds. However, he also mentions a recent injury to the groin area while doing some flooring work about three weeks ago. The patient describes the pain as a sprain-like sensation that is exacerbated by certain movements, but denies any associated weakness in the leg or bruising in the area. He has been managing the pain without medication due to concerns about his blood pressure.  Despite the injury, the patient has been trying to maintain physical activity by walking more, including regular walks at a local park. He has also been making changes to his diet, including eating egg-based meals in the morning and for lunch, and avoiding  the vending machine at work. He reports struggling with meal timing and often skips breakfast due to lack of time and appetite. For dinner, he often opts for salads with grilled chicken from a fast-food restaurant or pre-packaged tuna. He also mentions a recent meal of salmon, broccoli, and potatoes.  The patient acknowledges the struggle of changing long-standing habits and the internal conflict he experiences in trying to make healthier choices. He expresses a desire for assistance in managing his insulin resistance and weight loss, and is open to trying new medications to help with these issues. He also expresses concern about his shoulder, which has been causing him discomfort, but this issue was not explored further in the consultation.         Barriers identified: exposure to enticing environments and/or relationships and difficulty implementing reduced calorie nutrition plan.   Pharmacotherapy for weight loss: He is currently taking no anti-obesity medication.    ASSESSMENT AND PLAN  TREATMENT PLAN FOR OBESITY:  Recommended Dietary Goals  Daniel Rosales is currently in the action stage of change. As such, his goal is to continue weight management plan. He has agreed to: continue current plan  Behavioral Intervention  We discussed the following Behavioral Modification Strategies today: continue to work on maintaining a reduced calorie state, getting the recommended amount of protein, incorporating whole foods, making healthy choices, staying well hydrated and practicing mindfulness when eating..  Additional resources provided today: None  Recommended Physical Activity Goals  Daniel Rosales  has been advised to work up to 150 minutes of moderate intensity aerobic activity a week and strengthening exercises 2-3 times per week for cardiovascular health, weight loss maintenance and preservation of muscle mass.   He has agreed to :  Continue to work on increasing volume of physical activity.  He would  benefit from a strengthening program currently walking.  Pharmacotherapy We discussed various medication options to help Daniel Rosales with his weight loss efforts and we both agreed to :  Patient will be started on metformin XR 500 mg 1 tablet in the morning for the management of insulin resistance as well as incretin effect.  This will also support weight loss efforts and reduce central adiposity.  ASSOCIATED CONDITIONS ADDRESSED TODAY  Insulin resistance Assessment & Plan: His HOMA-IR is 5.1 which is elevated. Optimal level < 1.9.   This is complex condition associated with genetics, ectopic fat and lifestyle factors. Insulin resistance may also result in weight gain, abnormal cravings (particularly for carbs) and fatigue. This may result in additional weight gain and lead to pre-diabetes and diabetes if untreated. In addition, hyperinsulinemia increases cardiovascular risk, chronic inflammatory response and may increase the risk of obesity related malignancies.  Lab Results  Component Value Date   HGBA1C 5.1 10/26/2012   Lab Results  Component Value Date   INSULIN 17.0 06/01/2023   Lab Results  Component Value Date   GLUCOSE 122 (H) 06/01/2023   GLUCOSE 94 06/19/2010    We reviewed treatment options which include losing 7 to 10% of body weight, increasing physical activity to a 150 minutes a week at moderate intensity.  He benefits from reducing simple and processed carbs in diet.  He may also be a candidate for pharmacoprophylaxis with metformin or incretin mimetic.    Orders: -     metFORMIN HCl ER; Take 1 tablet (500 mg total) by mouth daily with breakfast.  Dispense: 30 tablet; Refill: 0  Essential hypertension Assessment & Plan: His blood pressure is improved but still not at goal.  He reports having difficulty getting an appointment with primary care.  He will continue on losartan hydrochlorothiazide at current dose I am increasing his amlodipine to 5 mg once  daily.  Orders: -     Losartan Potassium-HCTZ; Take 1 tablet by mouth daily.  Dispense: 30 tablet; Refill: 0 -     amLODIPine Besylate; Take 1 tablet (5 mg total) by mouth daily.  Dispense: 30 tablet; Refill: 0  Class 3 severe obesity with serious comorbidity and body mass index (BMI) of 40.0 to 44.9 in adult, unspecified obesity type Daniel Rosales) Assessment & Plan: See obesity treatment plan  Orders: -     metFORMIN HCl ER; Take 1 tablet (500 mg total) by mouth daily with breakfast.  Dispense: 30 tablet; Refill: 0  Abnormal metabolism Assessment & Plan: Patient has a slower than predicted metabolism. IC 2045 vs. calculated 2496.  Likely secondary to chronic skipping of meals and symptoms suspicious for obstructive sleep apnea.  This may contribute to weight gain, chronic fatigue and difficulty losing weight.   We reviewed measures to improve metabolism including not skipping meals, progressive strengthening exercises, increasing protein intake at every meal and maintaining adequate hydration and sleep.       Metabolic dysfunction-associated steatotic liver disease (MASLD) Assessment & Plan: Asymptomatic.  I have reviewed previous imaging.  He had a CT scan done a few years ago for kidney stones which showed the presence of hepatic steatosis.  His liver enzymes are  normal.  He does not drink alcohol or take over-the-counter supplements.  His fib 4 score is low.  Losing 15% of body weight as well as simple and processed carbs will help with condition.  Also treatment with incretin therapy.     PHYSICAL EXAM:  Blood pressure (!) 145/95, pulse 66, temperature 97.9 F (36.6 C), height 5\' 10"  (1.778 m), weight 290 lb (131.5 kg), SpO2 99%. Body mass index is 41.61 kg/m.  General: He is overweight, cooperative, alert, well developed, and in no acute distress. PSYCH: Has normal mood, affect and thought process.   HEENT: EOMI, sclerae are anicteric. Lungs: Normal breathing effort, no  conversational dyspnea. Extremities: No edema.  Neurologic: No gross sensory or motor deficits. No tremors or fasciculations noted.    DIAGNOSTIC DATA REVIEWED:  BMET    Component Value Date/Time   NA 141 06/01/2023 0927   K 4.2 06/01/2023 0927   CL 103 06/01/2023 0927   CO2 26 06/01/2023 0927   GLUCOSE 122 (H) 06/01/2023 0927   GLUCOSE 137 (H) 07/09/2021 2311   BUN 10 06/01/2023 0927   CREATININE 0.72 (L) 06/01/2023 0927   CALCIUM 9.5 06/01/2023 0927   GFRNONAA >60 07/09/2021 2311   Lab Results  Component Value Date   HGBA1C 5.1 10/26/2012   Lab Results  Component Value Date   INSULIN 17.0 06/01/2023   Lab Results  Component Value Date   TSH 1.23 07/24/2016   CBC    Component Value Date/Time   WBC 9.7 07/09/2021 2311   RBC 4.41 07/09/2021 2311   HGB 13.2 07/09/2021 2311   HCT 39.1 07/09/2021 2311   PLT 185 07/09/2021 2311   MCV 88.7 07/09/2021 2311   MCH 29.9 07/09/2021 2311   MCHC 33.8 07/09/2021 2311   RDW 12.3 07/09/2021 2311   Iron Studies No results found for: "IRON", "TIBC", "FERRITIN", "IRONPCTSAT" Lipid Panel     Component Value Date/Time   CHOL 219 (H) 10/27/2018 1032   TRIG 73.0 10/27/2018 1032   HDL 47.30 10/27/2018 1032   CHOLHDL 5 10/27/2018 1032   VLDL 14.6 10/27/2018 1032   LDLCALC 157 (H) 10/27/2018 1032   LDLDIRECT 155.3 10/26/2012 0801   Hepatic Function Panel     Component Value Date/Time   PROT 6.8 06/01/2023 0927   ALBUMIN 4.4 06/01/2023 0927   AST 17 06/01/2023 0927   ALT 25 06/01/2023 0927   ALKPHOS 103 06/01/2023 0927   BILITOT 0.5 06/01/2023 0927   BILIDIR 0.1 07/24/2016 0819      Component Value Date/Time   TSH 1.23 07/24/2016 0819   Nutritional Lab Results  Component Value Date   VD25OH 10.6 (L) 06/01/2023     Return in about 3 weeks (around 09/15/2023).Marland Kitchen He was informed of the importance of frequent follow up visits to maximize his success with intensive lifestyle modifications for his multiple health  conditions.   ATTESTASTION STATEMENTS:  Reviewed by clinician on day of visit: allergies, medications, problem list, medical history, surgical history, family history, social history, and previous encounter notes.     Worthy Rancher, MD

## 2023-08-25 NOTE — Assessment & Plan Note (Signed)
His HOMA-IR is 5.1 which is elevated. Optimal level < 1.9.   This is complex condition associated with genetics, ectopic fat and lifestyle factors. Insulin resistance may also result in weight gain, abnormal cravings (particularly for carbs) and fatigue. This may result in additional weight gain and lead to pre-diabetes and diabetes if untreated. In addition, hyperinsulinemia increases cardiovascular risk, chronic inflammatory response and may increase the risk of obesity related malignancies.  Lab Results  Component Value Date   HGBA1C 5.1 10/26/2012   Lab Results  Component Value Date   INSULIN 17.0 06/01/2023   Lab Results  Component Value Date   GLUCOSE 122 (H) 06/01/2023   GLUCOSE 94 06/19/2010    We reviewed treatment options which include losing 7 to 10% of body weight, increasing physical activity to a 150 minutes a week at moderate intensity.  He benefits from reducing simple and processed carbs in diet.  He may also be a candidate for pharmacoprophylaxis with metformin or incretin mimetic.

## 2023-08-25 NOTE — Assessment & Plan Note (Signed)
 See obesity treatment plan

## 2023-09-02 NOTE — Progress Notes (Signed)
See procedure note.

## 2023-09-07 NOTE — Procedures (Signed)
   [facility]  HOME SLEEP TEST (Watch PAT) REPORT -  Mail-out Device  STUDY DATE: 08/31/2023  DOB: 03/06/1966  MRN: [id number]  ORDERING CLINICIAN: Huston Foley, MD, PhD   REFERRING CLINICIAN: Dr. Worthy Rancher  CLINICAL INFORMATION/HISTORY: 57 year old male with an underlying medical history of hypertension, hyperlipidemia, hepatic steatosis, vitamin B12 deficiency, vitamin D deficiency, and severe obesity with a BMI of over 40, who reports snoring and excessive daytime somnolence.   Epworth sleepiness score: 9/24.  BMI: 42.7 kg/m  FINDINGS:   Sleep Summary:   Total Recording Time (hours, min): 8 hours, 0 min  Total Sleep Time (hours, min):  7 hours, 24 min  Percent REM (%):    33.2%   Respiratory Indices:   Calculated pAHI (per hour):  18.1/hour         REM pAHI:    38/hour       NREM pAHI: 7.9/hour  Central pAHI: 0/hour  Oxygen Saturation Statistics:    Oxygen Saturation (%) Mean: 94%   Minimum oxygen saturation (%):                 78%   O2 Saturation Range (%): 78 - 99%    O2 Saturation (minutes) <=88%: 1.7 min  Pulse Rate Statistics:   Pulse Mean (bpm):    63/min    Pulse Range (41 - 107/min)   IMPRESSION: OSA (obstructive sleep apnea), moderate  RECOMMENDATION:  This home sleep test demonstrates moderate obstructive sleep apnea with a total AHI of 18.1/hour and O2 nadir of 78%.  Mild to moderate snoring was detected, at times louder. Treatment with a positive airway pressure (PAP) device is recommended. The patient will be advised to proceed with an autoPAP titration/trial at home for now. A full night titration study may be considered to optimize treatment settings, monitor proper oxygen saturations and aid with improvement of tolerance and adherence, if needed down the road. Alternative treatment options may include a dental device through dentistry or orthodontics in selected patients or Inspire (hypoglossal nerve  stimulator) in carefully selected patients (meeting inclusion criteria).  Concomitant weight loss is recommended (where clinically appropriate). Please note that untreated obstructive sleep apnea may carry additional perioperative morbidity. Patients with significant obstructive sleep apnea should receive perioperative PAP therapy and the surgeons and particularly the anesthesiologist should be informed of the diagnosis and the severity of the sleep disordered breathing. The patient should be cautioned not to drive, work at heights, or operate dangerous or heavy equipment when tired or sleepy. Review and reiteration of good sleep hygiene measures should be pursued with any patient. Other causes of the patient's symptoms, including circadian rhythm disturbances, an underlying mood disorder, medication effect and/or an underlying medical problem cannot be ruled out based on this test. Clinical correlation is recommended.  The patient and his referring provider will be notified of the test results. The patient will be seen in follow up in sleep clinic at [facility].  I certify that I have reviewed the raw data recording prior to the issuance of this report in accordance with the standards of the American Academy of Sleep Medicine (AASM).    INTERPRETING PHYSICIAN:   Huston Foley, MD, PhD Medical Director, [facility]) Diplomat, ABPN (Neurology and Sleep)   [facility] [address], [address] [place], [place] [address] [phone]

## 2023-09-07 NOTE — Addendum Note (Signed)
Addended by: Huston Foley on: 09/07/2023 02:46 PM   Modules accepted: Orders

## 2023-09-09 ENCOUNTER — Telehealth: Payer: Self-pay | Admitting: *Deleted

## 2023-09-09 NOTE — Telephone Encounter (Signed)
I called the patient and LVM with office number and hours asking for call back. I sent him a mychart message too.

## 2023-09-09 NOTE — Telephone Encounter (Signed)
-----   Message from Huston Foley sent at 09/07/2023  2:46 PM EST ----- Patient referred by Dr. Rikki Spearing, seen by me on 07/29/23, patient had a HST on 08/31/2023.    Please call and notify the patient that the recent home sleep test showed obstructive sleep apnea in the moderate range. I recommend treatment in the form of autoPAP, which means, that we don't have to bring him in for a sleep study with CPAP, but will let him start using a so called autoPAP machine at home, which is a CPAP-like machine with self-adjusting pressures. We will send the order to a local DME company (of his choice, or as per insurance requirement). The DME representative will fit him with a mask, educate him on how to use the machine, how to put the mask on, etc. I have placed an order in the chart. Please send the order, talk to patient, send report to referring MD. We will need a FU in sleep clinic for 10 weeks post-PAP set up, please arrange that with me or one of our NPs. Also reinforce the need for compliance with treatment. Thanks,   Huston Foley, MD, PhD [facility])

## 2023-09-12 ENCOUNTER — Other Ambulatory Visit (INDEPENDENT_AMBULATORY_CARE_PROVIDER_SITE_OTHER): Payer: Self-pay | Admitting: Internal Medicine

## 2023-09-12 DIAGNOSIS — E538 Deficiency of other specified B group vitamins: Secondary | ICD-10-CM

## 2023-09-14 NOTE — Telephone Encounter (Signed)
Pt hasn't read my last mychart message. I called the pt and LVM asking for call back to follow-up.

## 2023-09-17 ENCOUNTER — Other Ambulatory Visit (INDEPENDENT_AMBULATORY_CARE_PROVIDER_SITE_OTHER): Payer: Self-pay | Admitting: Internal Medicine

## 2023-09-17 DIAGNOSIS — E66813 Obesity, class 3: Secondary | ICD-10-CM

## 2023-09-17 DIAGNOSIS — I10 Essential (primary) hypertension: Secondary | ICD-10-CM

## 2023-09-17 DIAGNOSIS — E88819 Insulin resistance, unspecified: Secondary | ICD-10-CM

## 2023-09-23 ENCOUNTER — Ambulatory Visit (INDEPENDENT_AMBULATORY_CARE_PROVIDER_SITE_OTHER): Payer: BC Managed Care – PPO | Admitting: Internal Medicine

## 2023-09-23 ENCOUNTER — Encounter (INDEPENDENT_AMBULATORY_CARE_PROVIDER_SITE_OTHER): Payer: Self-pay | Admitting: Internal Medicine

## 2023-09-23 VITALS — BP 149/83 | HR 72 | Temp 97.8°F | Ht 70.0 in | Wt 293.0 lb

## 2023-09-23 DIAGNOSIS — E88819 Insulin resistance, unspecified: Secondary | ICD-10-CM | POA: Diagnosis not present

## 2023-09-23 DIAGNOSIS — E66813 Obesity, class 3: Secondary | ICD-10-CM

## 2023-09-23 DIAGNOSIS — I10 Essential (primary) hypertension: Secondary | ICD-10-CM | POA: Diagnosis not present

## 2023-09-23 DIAGNOSIS — R948 Abnormal results of function studies of other organs and systems: Secondary | ICD-10-CM | POA: Diagnosis not present

## 2023-09-23 DIAGNOSIS — E559 Vitamin D deficiency, unspecified: Secondary | ICD-10-CM | POA: Diagnosis not present

## 2023-09-23 DIAGNOSIS — Z6841 Body Mass Index (BMI) 40.0 and over, adult: Secondary | ICD-10-CM

## 2023-09-23 DIAGNOSIS — K760 Fatty (change of) liver, not elsewhere classified: Secondary | ICD-10-CM

## 2023-09-23 MED ORDER — LOSARTAN POTASSIUM-HCTZ 100-12.5 MG PO TABS: 1.0000 | ORAL_TABLET | Freq: Every day | ORAL | 0 refills | Status: AC

## 2023-09-23 MED ORDER — METFORMIN HCL ER 500 MG PO TB24: 500.0000 mg | ORAL_TABLET | Freq: Every day | ORAL | 0 refills | Status: AC

## 2023-09-23 MED ORDER — AMLODIPINE BESYLATE 5 MG PO TABS: 5.0000 mg | ORAL_TABLET | Freq: Every day | ORAL | 0 refills | Status: AC

## 2023-09-23 MED ORDER — VITAMIN D (ERGOCALCIFEROL) 1.25 MG (50000 UNIT) PO CAPS: 50000.0000 [IU] | ORAL_CAPSULE | ORAL | 0 refills | Status: AC

## 2023-09-23 NOTE — Progress Notes (Signed)
Office: [phone]  /  Fax: [phone]  Weight Summary And Biometrics  Vitals Temp: 97.8 F (36.6 C) BP: (!) 149/83 Pulse Rate: 72 SpO2: 98 %   Anthropometric Measurements Height: 5\' 10"  (1.778 m) Weight: 293 lb (132.9 kg) BMI (Calculated): 42.04 Weight at Last Visit: 290 lb Weight Lost Since Last Visit: 0 lb Weight Gained Since Last Visit: 3 lb Starting Weight: 297 lb Total Weight Loss (lbs): 4 lb (1.814 kg) Peak Weight: 301 lb   Body Composition  Body Fat %: 39.8 % Fat Mass (lbs): 116.8 lbs Muscle Mass (lbs): 168.2 lbs Total Body Water (lbs): 121.4 lbs Visceral Fat Rating : 25    RMR: 2045  Today's Visit #: 6  Starting Date: 05/20/23   Subjective   Chief Complaint: Obesity  Daniel Rosales is here to discuss his progress with his obesity treatment plan. He is on the the Category 3 Plan and states he is following his eating plan approximately 50 % of the time. He states he is exercising 30 minutes 2 times per week.  Interval History:   Discussed the use of AI scribe software for clinical note transcription with the patient, who gave verbal consent to proceed.  History of Present Illness    Daniel Rosales, a patient with obesity, hypertension, prediabetes, and MASLD presents today for weight management. The patient reports difficulty with dietary choices, attributing this primarily to time constraints and lack of meal preparation. The patient's current dietary pattern consists of two meals a day, with lunch being the largest meal. Snacking is limited to prevent overeating, but the choices are often unhealthy, typically from a vending machine. 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  The patient reports a lack of consistent hunger, often questioning the need to eat. Despite this, the patient's wife has expressed concerns about portion Rosales when the patient does eat. The patient acknowledges a long-standing habit of consuming large  quantities of food in one sitting, a behavior developed from a desire to prevent food waste.  The patient has a history of successful weight loss prior to the COVID-19 pandemic, during which regular exercise and healthier eating habits were maintained. However, the pandemic has disrupted these routines, leading to weight gain and a perceived loss of hope in weight management.  The patient has been diagnosed with moderate sleep apnea, with oxygen levels dropping to 78% during sleep. This condition is currently untreated, pending insurance approval for a CPAP machine. The patient acknowledges the potential health risks associated with untreated sleep apnea, including fatigue, memory problems, high blood pressure, and increased risk of stroke and heart disease.  The patient is currently taking medications for hypertension and prediabetes, and has completed a four-month course of Vitamin D. The patient is compliant with medication regimens and reports no issues with medication adherence. The patient is scheduled for a follow-up appointment in January.       Daniel Rosales:  Denies problems with appetite and hunger signals.  Denies problems with satiety and satiation.  Reports problems with eating patterns and portion Rosales.  Denies abnormal cravings. Denies feeling deprived or restricted.   Barriers identified: having difficulty with meal prep and planning, need for convenience or prepackaged foods, medical comorbidities, work schedule, slow metabolism for age, and sleep apnea.   Pharmacotherapy for weight loss: He is currently taking no anti-obesity medication.   Assessment and Plan   Treatment Plan For Obesity:  Recommended Dietary Goals  Daniel Rosales is currently  in the action stage of change. As such, his goal is to continue weight management plan. He has agreed to: continue current plan  Behavioral Intervention  We discussed the following Behavioral Modification Strategies today:  continue to work on maintaining a reduced calorie state, getting the recommended amount of protein, incorporating whole foods, making healthy choices, staying well hydrated and practicing mindfulness when eating..  Additional resources provided today: None  Recommended Physical Activity Goals  Daniel Rosales has been advised to work up to 150 minutes of moderate intensity aerobic activity a week and strengthening exercises 2-3 times per week for cardiovascular health, weight loss maintenance and preservation of muscle mass.   He has agreed to :  Think about enjoyable ways to increase daily physical activity and overcoming barriers to exercise and Increase physical activity in their day and reduce sedentary time (increase NEAT).  Pharmacotherapy  We discussed various medication options to help Daniel Rosales with his weight loss efforts and we both agreed to : continue with nutritional and behavioral strategies  Associated Conditions Addressed Today  Abnormal metabolism  Insulin resistance -     metFORMIN HCl ER; Take 1 tablet (500 mg total) by mouth daily with breakfast.  Dispense: 90 tablet; Refill: 0  Class 3 severe obesity with serious comorbidity and body mass index (BMI) of 40.0 to 44.9 in adult, unspecified obesity type (HCC) -     metFORMIN HCl ER; Take 1 tablet (500 mg total) by mouth daily with breakfast.  Dispense: 90 tablet; Refill: 0  Essential hypertension -     amLODIPine Besylate; Take 1 tablet (5 mg total) by mouth daily.  Dispense: 90 tablet; Refill: 0 -     Losartan Potassium-HCTZ; Take 1 tablet by mouth daily.  Dispense: 90 tablet; Refill: 0  Vitamin D deficiency -     Vitamin D (Ergocalciferol); Take 1 capsule (50,000 Units total) by mouth every 7 (seven) days.  Dispense: 16 capsule; Refill: 0  Metabolic dysfunction-associated steatotic liver disease (MASLD)    Assessment and Plan    Obesity Daniel Rosales presents for weight management, struggling with meal preparation and a reliance  on unhealthy snacks. He consumes two meals daily, with lunch being the largest, and snacks before 6 PM. Previously, he achieved significant weight loss, which was regained post-COVID-19. The discussion emphasized the importance of meal prepping, establishing a routine, and mindset change for sustainable weight management, moving from contemplation to action. Bill will discuss meal prepping with his spouse, create a routine for balanced meals, consider prepackaged options for breakfast and lunch, and plan healthy dinners with his spouse.  Obstructive Sleep Apnea (OSA) Atari has moderate OSA, with an AHI of 18 and oxygen levels dropping to 78%, adversely affecting his energy, memory, mood, and quality of life. The critical role of CPAP therapy was highlighted, including its benefits and the severe risks of untreated sleep apnea, such as stroke, heart disease, and a shortened lifespan. CPAP therapy could markedly improve symptoms and life quality despite the initial adjustment period. Grayton will obtain a CPAP machine, follow up with insurance about coverage, explore aftermarket options if necessary, and adhere to CPAP therapy to ameliorate symptoms.  Hypertension Brenyn's hypertension, treated with losartan hydrochlorothiazide and amlodipine, persists above 140/90 mmHg. The plan is to increase losartan hydrochlorothiazide to 100/12.5 mg for enhanced Rosales while continuing amlodipine. A follow-up with his PCP is scheduled for January or February.    Prediabetes Bridger, diagnosed with prediabetes and on metformin, was counseled on the significance of weight management and  lifestyle modifications to halt progression to diabetes. He will continue metformin, monitor blood glucose levels, and implement the discussed dietary changes.  General Health Maintenance A plan is in place to check blood levels in January. Elion will continue vitamin D supplementation for four more months and check vitamin D levels in  January.  Follow-up A follow-up appointment is scheduled for January. Maximilliano's medications, including losartan hydrochlorothiazide, amlodipine, metformin, and vitamin D, will be refilled and sent to [facility].        Objective   Physical Exam:  Blood pressure (!) 149/83, pulse 72, temperature 97.8 F (36.6 C), height 5\' 10"  (1.778 m), weight 293 lb (132.9 kg), SpO2 98%. Body mass index is 42.04 kg/m.  General: He is overweight, cooperative, alert, well developed, and in no acute distress. PSYCH: Has normal mood, affect and thought process.   HEENT: EOMI, sclerae are anicteric. Lungs: Normal breathing effort, no conversational dyspnea. Extremities: No edema.  Neurologic: No gross sensory or motor deficits. No tremors or fasciculations noted.    Diagnostic Data Reviewed:  BMET    Component Value Date/Time   NA 141 06/01/2023 0927   K 4.2 06/01/2023 0927   CL 103 06/01/2023 0927   CO2 26 06/01/2023 0927   GLUCOSE 122 (H) 06/01/2023 0927   GLUCOSE 137 (H) 07/09/2021 2311   BUN 10 06/01/2023 0927   CREATININE 0.72 (L) 06/01/2023 0927   CALCIUM 9.5 06/01/2023 0927   GFRNONAA >60 07/09/2021 2311   Lab Results  Component Value Date   HGBA1C 5.1 10/26/2012   Lab Results  Component Value Date   INSULIN 17.0 06/01/2023   Lab Results  Component Value Date   TSH 1.23 07/24/2016   CBC    Component Value Date/Time   WBC 9.7 07/09/2021 2311   RBC 4.41 07/09/2021 2311   HGB 13.2 07/09/2021 2311   HCT 39.1 07/09/2021 2311   PLT 185 07/09/2021 2311   MCV 88.7 07/09/2021 2311   MCH 29.9 07/09/2021 2311   MCHC 33.8 07/09/2021 2311   RDW 12.3 07/09/2021 2311   Iron Studies No results found for: "IRON", "TIBC", "FERRITIN", "IRONPCTSAT" Lipid Panel     Component Value Date/Time   CHOL 219 (H) 10/27/2018 1032   TRIG 73.0 10/27/2018 1032   HDL 47.30 10/27/2018 1032   CHOLHDL 5 10/27/2018 1032   VLDL 14.6 10/27/2018 1032   LDLCALC 157 (H) 10/27/2018 1032   LDLDIRECT 155.3  10/26/2012 0801   Hepatic Function Panel     Component Value Date/Time   PROT 6.8 06/01/2023 0927   ALBUMIN 4.4 06/01/2023 0927   AST 17 06/01/2023 0927   ALT 25 06/01/2023 0927   ALKPHOS 103 06/01/2023 0927   BILITOT 0.5 06/01/2023 0927   BILIDIR 0.1 07/24/2016 0819      Component Value Date/Time   TSH 1.23 07/24/2016 0819   Nutritional Lab Results  Component Value Date   VD25OH 10.6 (L) 06/01/2023    Follow-Up   Return in about 4 weeks (around 10/21/2023) for For Weight Mangement with Dr. Rikki Spearing.Marland Kitchen He was informed of the importance of frequent follow up visits to maximize his success with intensive lifestyle modifications for his multiple health conditions.  Attestation Statement   Reviewed by clinician on day of visit: allergies, medications, problem list, medical history, surgical history, family history, social history, and previous encounter notes.     Worthy Rancher, MD

## 2023-10-19 NOTE — Telephone Encounter (Signed)
Called pt & LVM with office hours and phone number, asking for call back.

## 2023-11-02 ENCOUNTER — Ambulatory Visit (INDEPENDENT_AMBULATORY_CARE_PROVIDER_SITE_OTHER): Payer: BC Managed Care – PPO | Admitting: Internal Medicine

## 2023-12-23 ENCOUNTER — Other Ambulatory Visit (INDEPENDENT_AMBULATORY_CARE_PROVIDER_SITE_OTHER): Payer: Self-pay | Admitting: Internal Medicine

## 2023-12-23 DIAGNOSIS — E88819 Insulin resistance, unspecified: Secondary | ICD-10-CM

## 2023-12-23 DIAGNOSIS — I10 Essential (primary) hypertension: Secondary | ICD-10-CM

## 2023-12-28 ENCOUNTER — Encounter (INDEPENDENT_AMBULATORY_CARE_PROVIDER_SITE_OTHER): Payer: Self-pay

## 2024-11-07 ENCOUNTER — Other Ambulatory Visit: Payer: Self-pay

## 2024-11-07 ENCOUNTER — Emergency Department (HOSPITAL_COMMUNITY)
Admission: EM | Admit: 2024-11-07 | Discharge: 2024-11-07 | Disposition: A | Discharge status: Home / Self Care | Attending: Emergency Medicine | Admitting: Emergency Medicine

## 2024-11-07 DIAGNOSIS — E1165 Type 2 diabetes mellitus with hyperglycemia: Secondary | ICD-10-CM | POA: Diagnosis not present

## 2024-11-07 DIAGNOSIS — R739 Hyperglycemia, unspecified: Secondary | ICD-10-CM | POA: Diagnosis present

## 2024-11-07 DIAGNOSIS — I10 Essential (primary) hypertension: Secondary | ICD-10-CM | POA: Insufficient documentation

## 2024-11-07 LAB — URINALYSIS, ROUTINE W REFLEX MICROSCOPIC
Bacteria, UA: NONE SEEN
Bilirubin Urine: NEGATIVE
Glucose, UA: >=500 mg/dL — AB
Hgb urine dipstick: NEGATIVE
Ketones, ur: 20 mg/dL — AB
Leukocytes,Ua: NEGATIVE
Nitrite: NEGATIVE
Protein, ur: NEGATIVE mg/dL
Specific Gravity, Urine: 1.029 (ref 1.005–1.030)
pH: 5 (ref 5.0–8.0)

## 2024-11-07 LAB — BLOOD GAS, VENOUS
Acid-Base Excess: 4.2 mmol/L — ABNORMAL HIGH (ref 0.0–2.0)
Bicarbonate: 30.3 mmol/L — ABNORMAL HIGH (ref 20.0–28.0)
O2 Saturation: 54.4 %
Patient temperature: 37
pCO2, Ven: 50 mmHg (ref 44–60)
pH, Ven: 7.39 (ref 7.25–7.43)
pO2, Ven: <31 mmHg — CL (ref 32–45)

## 2024-11-07 LAB — BASIC METABOLIC PANEL WITH GFR
Anion gap: 9 (ref 5–15)
BUN: 9 mg/dL (ref 6–20)
CO2: 26 mmol/L (ref 22–32)
Calcium: 9.5 mg/dL (ref 8.9–10.3)
Chloride: 103 mmol/L (ref 98–111)
Creatinine, Ser: 0.74 mg/dL (ref 0.61–1.24)
GFR, Estimated: >60 mL/min
Glucose, Bld: 357 mg/dL — ABNORMAL HIGH (ref 70–99)
Potassium: 4.3 mmol/L (ref 3.5–5.1)
Sodium: 138 mmol/L (ref 135–145)

## 2024-11-07 LAB — CBC WITH DIFFERENTIAL/PLATELET
Abs Immature Granulocytes: 0.04 K/uL (ref 0.00–0.07)
Basophils Absolute: 0 K/uL (ref 0.0–0.1)
Basophils Relative: 0 %
Eosinophils Absolute: 0.1 K/uL (ref 0.0–0.5)
Eosinophils Relative: 1 %
HCT: 40.6 % (ref 39.0–52.0)
Hemoglobin: 13.8 g/dL (ref 13.0–17.0)
Immature Granulocytes: 1 %
Lymphocytes Relative: 28 %
Lymphs Abs: 1.3 K/uL (ref 0.7–4.0)
MCH: 29.2 pg (ref 26.0–34.0)
MCHC: 34 g/dL (ref 30.0–36.0)
MCV: 86 fL (ref 80.0–100.0)
Monocytes Absolute: 0.3 K/uL (ref 0.1–1.0)
Monocytes Relative: 7 %
Neutro Abs: 3 K/uL (ref 1.7–7.7)
Neutrophils Relative %: 63 %
Platelets: 148 K/uL — ABNORMAL LOW (ref 150–400)
RBC: 4.72 MIL/uL (ref 4.22–5.81)
RDW: 12 % (ref 11.5–15.5)
WBC: 4.8 K/uL (ref 4.0–10.5)
nRBC: 0 % (ref 0.0–0.2)

## 2024-11-07 LAB — CBG MONITORING, ED
Glucose-Capillary: 340 mg/dL — ABNORMAL HIGH (ref 70–99)
Glucose-Capillary: 264 mg/dL — ABNORMAL HIGH (ref 70–99)
Glucose-Capillary: 234 mg/dL — ABNORMAL HIGH (ref 70–99)

## 2024-11-07 MED ORDER — SODIUM CHLORIDE 0.9 % IV BOLUS
500.0000 mL | Freq: Once | INTRAVENOUS | Status: AC
  Administered 2024-11-07: 500 mL via INTRAVENOUS

## 2024-11-07 MED ORDER — INSULIN ASPART 100 UNIT/ML IJ SOLN
6.0000 [IU] | Freq: Once | INTRAMUSCULAR | Status: AC
  Administered 2024-11-07: 6 [IU] via INTRAVENOUS
  Filled 2024-11-07: qty 6

## 2024-11-07 NOTE — Discharge Instructions (Signed)
 Your blood sugars are elevated here but are coming down with insulin  and IV fluids.  Please keep your appointment with your primary care doctor tomorrow to start treatment for your diabetes.  If you develop any new or suddenly worsening symptoms such as shortness of breath, abdominal pain, vomiting you should return to the emergency department for reevaluation.

## 2024-11-07 NOTE — ED Triage Notes (Signed)
 Pt ambulatory to triage at the advice of Dr. Larnell. Pt was found to have a high level of ketones in his urine, and there is concern for possible DKA. PT denies any complaints.

## 2024-11-07 NOTE — ED Provider Notes (Signed)
 "  Emergency Department Provider Note   I have reviewed the triage vital signs and the nursing notes.   HISTORY  Chief Complaint Abnormal Lab   HPI Daniel Rosales is a 59 y.o. male with past hypertension, hyperlipidemia, new diagnosis on Friday of diabetes presents to the emergency department with abnormal labs from that PCP visit.  Labs were drawn on Friday with results coming back today showing hyperglycemia with large ketones on UA.  Patient was called and sent to the emergency department for evaluation of possible DKA.  He has not had confusion or vomiting.  No abdominal cramping or pain.  No shortness of breath.  He has had polyuria and polydipsia along with dry mouth.  He has a follow-up appointment tomorrow to start treatment for diabetes with his PCP.    Past Medical History:  Diagnosis Date   ED (erectile dysfunction)    Hyperlipidemia    Hypertension    Sleep apnea     Review of Systems  Constitutional: No fever/chills Cardiovascular: Denies chest pain. Respiratory: Denies shortness of breath. Gastrointestinal: No abdominal pain.  No nausea, no vomiting.  No diarrhea.  No constipation. Genitourinary: Negative for dysuria. Positive polyuria.  Skin: Negative for rash. Neurological: Negative for headaches.  ____________________________________________   PHYSICAL EXAM:  VITAL SIGNS: ED Triage Vitals [11/07/24 0933]  Encounter Vitals Group     BP (!) 158/105     Pulse Rate 91     Resp 20     Temp 98.4 F (36.9 C)     Temp Source Oral     SpO2 100 %   Constitutional: Alert and oriented. Well appearing and in no acute distress. Eyes: Conjunctivae are normal. Head: Atraumatic. Nose: No congestion/rhinnorhea. Mouth/Throat: Mucous membranes are dry.  Neck: No stridor.   Cardiovascular: Normal rate, regular rhythm. Good peripheral circulation. Grossly normal heart sounds.   Respiratory: Normal respiratory effort.  No retractions. Lungs  CTAB. Gastrointestinal: Soft and nontender. No distention.  Musculoskeletal: No gross deformities of extremities. Neurologic:  Normal speech and language.  Skin:  Skin is warm, dry and intact. No rash noted.   ____________________________________________   LABS (all labs ordered are listed, but only abnormal results are displayed)  Labs Reviewed  BASIC METABOLIC PANEL WITH GFR - Abnormal; Notable for the following components:      Result Value   Glucose, Bld 357 (*)    All other components within normal limits  CBC WITH DIFFERENTIAL/PLATELET - Abnormal; Notable for the following components:   Platelets 148 (*)    All other components within normal limits  BLOOD GAS, VENOUS - Abnormal; Notable for the following components:   pO2, Ven <31 (*)    Bicarbonate 30.3 (*)    Acid-Base Excess 4.2 (*)    All other components within normal limits  URINALYSIS, ROUTINE W REFLEX MICROSCOPIC - Abnormal; Notable for the following components:   Glucose, UA >=500 (*)    Ketones, ur 20 (*)    All other components within normal limits  CBG MONITORING, ED - Abnormal; Notable for the following components:   Glucose-Capillary 340 (*)    All other components within normal limits  CBG MONITORING, ED - Abnormal; Notable for the following components:   Glucose-Capillary 264 (*)    All other components within normal limits  CBG MONITORING, ED - Abnormal; Notable for the following components:   Glucose-Capillary 234 (*)    All other components within normal limits  BETA-HYDROXYBUTYRIC ACID    ____________________________________________  PROCEDURES  Procedure(s) performed:   Procedures  None  ____________________________________________   INITIAL IMPRESSION / ASSESSMENT AND PLAN / ED COURSE  Pertinent labs & imaging results that were available during my care of the patient were reviewed by me and considered in my medical decision making (see chart for details).   This patient is  Presenting for Evaluation of hyperglycemia, which does require a range of treatment options, and is a complaint that involves a high risk of morbidity and mortality.  The Differential Diagnoses include DM, DKA, lab error, AKI, electrolyte disturbance, etc.  Critical Interventions-    Medications  sodium chloride  0.9 % bolus 500 mL (500 mLs Intravenous New Bag/Given 11/07/24 1002)  insulin  aspart (novoLOG ) injection 6 Units (6 Units Intravenous Given 11/07/24 1159)    Reassessment after intervention: CBG down-trending.    I did obtain Additional Historical Information from wife at bedside.   I decided to review pertinent External Data, and in summary paper records at bedside show glucose > 300 with A1c > 14. Large ketone on urine dip. No anion gap calculated but CO2 normal.    Clinical Laboratory Tests Ordered, included CBC without leukocytosis or anemia.  Basic metabolic shows hyperglycemia to 357 with normal CO2 and anion gap of 9.  CBG downtrending to 234 with insulin  and fluids.  Cardiac Monitor Tracing which shows NSR.    Social Determinants of Health Risk patient is a non-smoker.   Medical Decision Making: Summary:  The patient presents to the emergency department with hyperglycemia found at an outpatient PCP visit.  Plan for screening lab work to rule out DKA.  No significant DKA symptoms or exam findings.  Plan for IV fluids.  He has an appointment with his PCP tomorrow to start treatment for diabetes. If no DKA, will defer Demyan Fugate-term treatment to PCP.   Reevaluation with update and discussion with patient.  CBG downtrending.  Has PCP follow-up appointment tomorrow to start outpatient management of diabetes.  Stable for discharge at this time.  Considered admission but no evidence of DKA. CBG down-trending.   Patient's presentation is most consistent with acute presentation with potential threat to life or bodily function.   Disposition:  discharge  ____________________________________________  FINAL CLINICAL IMPRESSION(S) / ED DIAGNOSES  Final diagnoses:  Hyperglycemia    Note:  This document was prepared using Dragon voice recognition software and may include unintentional dictation errors.  Fonda Law, MD, [facility] Emergency Medicine    Prarthana Parlin, Fonda MATSU, MD 11/07/24 1318  "
